# Patient Record
Sex: Male | Born: 1950
Health system: Southern US, Community
[De-identification: ages and names within clinical notes are randomized; demographics above are authoritative.]

## PROBLEM LIST (undated history)

## (undated) DIAGNOSIS — Z973 Presence of spectacles and contact lenses: Secondary | ICD-10-CM

## (undated) DIAGNOSIS — Z972 Presence of dental prosthetic device (complete) (partial): Secondary | ICD-10-CM

## (undated) DIAGNOSIS — K439 Ventral hernia without obstruction or gangrene: Secondary | ICD-10-CM

## (undated) DIAGNOSIS — Z974 Presence of external hearing-aid: Secondary | ICD-10-CM

## (undated) DIAGNOSIS — E782 Mixed hyperlipidemia: Secondary | ICD-10-CM

## (undated) DIAGNOSIS — H9190 Unspecified hearing loss, unspecified ear: Secondary | ICD-10-CM

## (undated) DIAGNOSIS — I10 Essential (primary) hypertension: Secondary | ICD-10-CM

## (undated) DIAGNOSIS — E7800 Pure hypercholesterolemia, unspecified: Secondary | ICD-10-CM

## (undated) DIAGNOSIS — C801 Malignant (primary) neoplasm, unspecified: Secondary | ICD-10-CM

## (undated) DIAGNOSIS — Z8546 Personal history of malignant neoplasm of prostate: Secondary | ICD-10-CM

## (undated) HISTORY — PX: APPENDECTOMY: SHX54

---

## 2015-06-26 DIAGNOSIS — I10 Essential (primary) hypertension: Secondary | ICD-10-CM | POA: Diagnosis not present

## 2015-06-26 DIAGNOSIS — E7800 Pure hypercholesterolemia, unspecified: Secondary | ICD-10-CM | POA: Diagnosis not present

## 2015-06-26 DIAGNOSIS — Z1389 Encounter for screening for other disorder: Secondary | ICD-10-CM | POA: Diagnosis not present

## 2015-06-26 DIAGNOSIS — Z125 Encounter for screening for malignant neoplasm of prostate: Secondary | ICD-10-CM | POA: Diagnosis not present

## 2015-06-26 DIAGNOSIS — R7303 Prediabetes: Secondary | ICD-10-CM | POA: Diagnosis not present

## 2015-09-04 DIAGNOSIS — R972 Elevated prostate specific antigen [PSA]: Secondary | ICD-10-CM | POA: Diagnosis not present

## 2015-10-23 DIAGNOSIS — N402 Nodular prostate without lower urinary tract symptoms: Secondary | ICD-10-CM | POA: Diagnosis not present

## 2015-10-23 DIAGNOSIS — Z0000 Encounter for general adult medical examination without abnormal findings: Secondary | ICD-10-CM | POA: Diagnosis not present

## 2015-10-23 DIAGNOSIS — R972 Elevated prostate specific antigen [PSA]: Secondary | ICD-10-CM | POA: Diagnosis not present

## 2015-11-28 DIAGNOSIS — R972 Elevated prostate specific antigen [PSA]: Secondary | ICD-10-CM | POA: Diagnosis not present

## 2015-11-29 DIAGNOSIS — R972 Elevated prostate specific antigen [PSA]: Secondary | ICD-10-CM | POA: Diagnosis not present

## 2015-11-29 DIAGNOSIS — Z0000 Encounter for general adult medical examination without abnormal findings: Secondary | ICD-10-CM | POA: Diagnosis not present

## 2015-12-26 DIAGNOSIS — C61 Malignant neoplasm of prostate: Secondary | ICD-10-CM | POA: Diagnosis not present

## 2015-12-26 DIAGNOSIS — R7303 Prediabetes: Secondary | ICD-10-CM | POA: Diagnosis not present

## 2015-12-26 DIAGNOSIS — Z9181 History of falling: Secondary | ICD-10-CM | POA: Diagnosis not present

## 2015-12-26 DIAGNOSIS — E7800 Pure hypercholesterolemia, unspecified: Secondary | ICD-10-CM | POA: Diagnosis not present

## 2015-12-26 DIAGNOSIS — Z6830 Body mass index (BMI) 30.0-30.9, adult: Secondary | ICD-10-CM | POA: Diagnosis not present

## 2015-12-26 DIAGNOSIS — I10 Essential (primary) hypertension: Secondary | ICD-10-CM | POA: Diagnosis not present

## 2016-01-11 DIAGNOSIS — C61 Malignant neoplasm of prostate: Secondary | ICD-10-CM | POA: Diagnosis not present

## 2016-02-13 DIAGNOSIS — C61 Malignant neoplasm of prostate: Secondary | ICD-10-CM | POA: Diagnosis not present

## 2016-02-16 ENCOUNTER — Other Ambulatory Visit: Payer: Self-pay | Admitting: Urology

## 2016-02-27 DIAGNOSIS — C61 Malignant neoplasm of prostate: Secondary | ICD-10-CM | POA: Diagnosis not present

## 2016-02-27 DIAGNOSIS — M6281 Muscle weakness (generalized): Secondary | ICD-10-CM | POA: Diagnosis not present

## 2016-03-04 DIAGNOSIS — C61 Malignant neoplasm of prostate: Secondary | ICD-10-CM | POA: Diagnosis not present

## 2016-03-04 DIAGNOSIS — M6281 Muscle weakness (generalized): Secondary | ICD-10-CM | POA: Diagnosis not present

## 2016-03-11 DIAGNOSIS — C61 Malignant neoplasm of prostate: Secondary | ICD-10-CM | POA: Diagnosis not present

## 2016-03-11 DIAGNOSIS — M6281 Muscle weakness (generalized): Secondary | ICD-10-CM | POA: Diagnosis not present

## 2016-03-20 NOTE — Patient Instructions (Addendum)
Kyle Flowers  03/20/2016   Your procedure is scheduled on: 03/25/16  Report to [facility]  Entrance take [place]  elevators to 3rd floor to  [place] at [place] AM.  Call this number if you have problems the morning of surgery [phone] BOWEL PREP AS DIRECTED BY OFFICE--INCREASE FLUIDS DAY BEFORE  Remember: ONLY 1 PERSON MAY GO WITH YOU TO SHORT STAY TO GET  READY MORNING OF YOUR SURGERY.  Do not take anything by mouth :After Midnight.     Take these medicines the morning of surgery with A SIP OF WATER: none DO NOT TAKE ANY DIABETIC MEDICATIONS DAY OF YOUR SURGERY                               You may not have any metal on your body including hair pins and              piercings  Do not wear jewelry, make-up, lotions, powders or perfumes, deodorant             Do not wear nail polish.  Do not shave  48 hours prior to surgery.              Men may shave face and neck.   Do not bring valuables to the hospital. [place].  Contacts, dentures or bridgework may not be worn into surgery.  Leave suitcase in the car. After surgery it may be brought to your room.              [place] - Preparing for Surgery Before surgery, you can play an important role.  Because skin is not sterile, your skin needs to be as free of germs as possible.  You can reduce the number of germs on your skin by washing with CHG (chlorahexidine gluconate) soap before surgery.  CHG is an antiseptic cleaner which kills germs and bonds with the skin to continue killing germs even after washing. Please DO NOT use if you have an allergy to CHG or antibacterial soaps.  If your skin becomes reddened/irritated stop using the CHG and inform your nurse when you arrive at Short Stay. Do not shave (including legs and underarms) for at least 48 hours prior to the first CHG shower.  You may shave your face/neck. Please follow these instructions  carefully:  1.  Shower with CHG Soap the night before surgery and the  morning of Surgery.  2.  If you choose to wash your hair, wash your hair first as usual with your  normal  shampoo.  3.  After you shampoo, rinse your hair and body thoroughly to remove the  shampoo.                           4.  Use CHG as you would any other liquid soap.  You can apply chg directly  to the skin and wash                       Gently with a scrungie or clean washcloth.  5.  Apply the CHG Soap to your body ONLY FROM THE NECK DOWN.   Do  not use on face/ open                           Wound or open sores. Avoid contact with eyes, ears mouth and genitals (private parts).                       Wash face,  Genitals (private parts) with your normal soap.             6.  Wash thoroughly, paying special attention to the area where your surgery  will be performed.  7.  Thoroughly rinse your body with warm water from the neck down.  8.  DO NOT shower/wash with your normal soap after using and rinsing off  the CHG Soap.                9.  Pat yourself dry with a clean towel.            10.  Wear clean pajamas.            11.  Place clean sheets on your bed the night of your first shower and do not  sleep with pets. Day of Surgery : Do not apply any lotions/deodorants the morning of surgery.  Please wear clean clothes to the hospital/surgery center.  FAILURE TO FOLLOW THESE INSTRUCTIONS MAY RESULT IN THE CANCELLATION OF YOUR SURGERY PATIENT SIGNATURE_________________________________  NURSE SIGNATURE__________________________________  ________________________________________________________________________    CLEAR LIQUID DIET   Foods Allowed                                                                     Foods Excluded  Coffee and tea, regular and decaf                             liquids that you cannot  Plain Jell-O in any flavor                                             see through such as: Fruit ices  (not with fruit pulp)                                     milk, soups, orange juice  Iced Popsicles                                    All solid food Carbonated beverages, regular and diet                                    Cranberry, grape and apple juices Sports drinks like Gatorade Lightly seasoned clear broth or consume(fat free) Sugar, honey syrup  Sample Menu Breakfast  Lunch                                     Supper Cranberry juice                    Beef broth                            Chicken broth Jell-O                                     Grape juice                           Apple juice Coffee or tea                        Jell-O                                      Popsicle                                                Coffee or tea                        Coffee or tea  _____________________________________________________________________    Incentive Spirometer  An incentive spirometer is a tool that can help keep your lungs clear and active. This tool measures how well you are filling your lungs with each breath. Taking long deep breaths may help reverse or decrease the chance of developing breathing (pulmonary) problems (especially infection) following:  A long period of time when you are unable to move or be active. BEFORE THE PROCEDURE   If the spirometer includes an indicator to show your best effort, your nurse or respiratory therapist will set it to a desired goal.  If possible, sit up straight or lean slightly forward. Try not to slouch.  Hold the incentive spirometer in an upright position. INSTRUCTIONS FOR USE  1. Sit on the edge of your bed if possible, or sit up as far as you can in bed or on a chair. 2. Hold the incentive spirometer in an upright position. 3. Breathe out normally. 4. Place the mouthpiece in your mouth and seal your lips tightly around it. 5. Breathe in slowly and as deeply as possible, raising the piston  or the ball toward the top of the column. 6. Hold your breath for 3-5 seconds or for as long as possible. Allow the piston or ball to fall to the bottom of the column. 7. Remove the mouthpiece from your mouth and breathe out normally. 8. Rest for a few seconds and repeat Steps 1 through 7 at least 10 times every 1-2 hours when you are awake. Take your time and take a few normal breaths between deep breaths. 9. The spirometer may include an indicator to show your best effort. Use the indicator as a goal to work toward during each repetition. 10. After each set of 10 deep breaths,  practice coughing to be sure your lungs are clear. If you have an incision (the cut made at the time of surgery), support your incision when coughing by placing a pillow or rolled up towels firmly against it. Once you are able to get out of bed, walk around indoors and cough well. You may stop using the incentive spirometer when instructed by your caregiver.  RISKS AND COMPLICATIONS  Take your time so you do not get dizzy or light-headed.  If you are in pain, you may need to take or ask for pain medication before doing incentive spirometry. It is harder to take a deep breath if you are having pain. AFTER USE  Rest and breathe slowly and easily.  It can be helpful to keep track of a log of your progress. Your caregiver can provide you with a simple table to help with this. If you are using the spirometer at home, follow these instructions: [place] IF:   You are having difficultly using the spirometer.  You have trouble using the spirometer as often as instructed.  Your pain medication is not giving enough relief while using the spirometer.  You develop fever of 100.5 F (38.1 C) or higher. SEEK IMMEDIATE MEDICAL CARE IF:   You cough up bloody sputum that had not been present before.  You develop fever of 102 F (38.9 C) or greater.  You develop worsening pain at or near the incision site. MAKE  SURE YOU:   Understand these instructions.  Will watch your condition.  Will get help right away if you are not doing well or get worse. Document Released: 11/25/2006 Document Revised: 10/07/2011 Document Reviewed: 01/26/2007 ExitCare Patient Information 2014 Exit[employer], [place].   ________________________________________________________________________  WHAT IS A BLOOD TRANSFUSION? Blood Transfusion Information  A transfusion is the replacement of blood or some of its parts. Blood is made up of multiple cells which provide different functions.  Red blood cells carry oxygen and are used for blood loss replacement.  White blood cells fight against infection.  Platelets control bleeding.  Plasma helps clot blood.  Other blood products are available for specialized needs, such as hemophilia or other clotting disorders. BEFORE THE TRANSFUSION  Who gives blood for transfusions?   Healthy volunteers who are fully evaluated to make sure their blood is safe. This is blood bank blood. Transfusion therapy is the safest it has ever been in the practice of medicine. Before blood is taken from a donor, a complete history is taken to make sure that person has no history of diseases nor engages in risky social behavior (examples are intravenous drug use or sexual activity with multiple partners). The donor's travel history is screened to minimize risk of transmitting infections, such as malaria. The donated blood is tested for signs of infectious diseases, such as HIV and hepatitis. The blood is then tested to be sure it is compatible with you in order to minimize the chance of a transfusion reaction. If you or a relative donates blood, this is often done in anticipation of surgery and is not appropriate for emergency situations. It takes many days to process the donated blood. RISKS AND COMPLICATIONS Although transfusion therapy is very safe and saves many lives, the main dangers of transfusion  include:   Getting an infectious disease.  Developing a transfusion reaction. This is an allergic reaction to something in the blood you were given. Every precaution is taken to prevent this. The decision to have a blood transfusion has been  considered carefully by your caregiver before blood is given. Blood is not given unless the benefits outweigh the risks. AFTER THE TRANSFUSION  Right after receiving a blood transfusion, you will usually feel much better and more energetic. This is especially true if your red blood cells have gotten low (anemic). The transfusion raises the level of the red blood cells which carry oxygen, and this usually causes an energy increase.  The nurse administering the transfusion will monitor you carefully for complications. HOME CARE INSTRUCTIONS  No special instructions are needed after a transfusion. You may find your energy is better. Speak with your caregiver about any limitations on activity for underlying diseases you may have. SEEK MEDICAL CARE IF:   Your condition is not improving after your transfusion.  You develop redness or irritation at the intravenous (IV) site. SEEK IMMEDIATE MEDICAL CARE IF:  Any of the following symptoms occur over the next 12 hours:  Shaking chills.  You have a temperature by mouth above 102 F (38.9 C), not controlled by medicine.  Chest, back, or muscle pain.  People around you feel you are not acting correctly or are confused.  Shortness of breath or difficulty breathing.  Dizziness and fainting.  You get a rash or develop hives.  You have a decrease in urine output.  Your urine turns a dark color or changes to pink, red, or brown. Any of the following symptoms occur over the next 10 days:  You have a temperature by mouth above 102 F (38.9 C), not controlled by medicine.  Shortness of breath.  Weakness after normal activity.  The white part of the eye turns yellow (jaundice).  You have a decrease in  the amount of urine or are urinating less often.  Your urine turns a dark color or changes to pink, red, or brown. Document Released: 07/12/2000 Document Revised: 10/07/2011 Document Reviewed: 02/29/2008 [facility] Patient Information 2014 [place], [place].  _______________________________________________________________________

## 2016-03-21 ENCOUNTER — Encounter (HOSPITAL_COMMUNITY): Payer: Self-pay

## 2016-03-21 ENCOUNTER — Encounter (HOSPITAL_COMMUNITY)
Admission: RE | Admit: 2016-03-21 | Discharge: 2016-03-21 | Disposition: A | Discharge status: Home / Self Care | Payer: Medicare HMO | Source: Ambulatory Visit | Attending: Urology | Admitting: Urology

## 2016-03-21 ENCOUNTER — Ambulatory Visit (HOSPITAL_COMMUNITY)
Admission: RE | Admit: 2016-03-21 | Discharge: 2016-03-21 | Disposition: A | Discharge status: Home / Self Care | Payer: Medicare HMO | Source: Ambulatory Visit | Attending: Urology | Admitting: Urology

## 2016-03-21 DIAGNOSIS — Z0189 Encounter for other specified special examinations: Secondary | ICD-10-CM

## 2016-03-21 DIAGNOSIS — J986 Disorders of diaphragm: Secondary | ICD-10-CM | POA: Insufficient documentation

## 2016-03-21 DIAGNOSIS — I700 Atherosclerosis of aorta: Secondary | ICD-10-CM | POA: Insufficient documentation

## 2016-03-21 DIAGNOSIS — J9811 Atelectasis: Secondary | ICD-10-CM | POA: Insufficient documentation

## 2016-03-21 DIAGNOSIS — Z01818 Encounter for other preprocedural examination: Secondary | ICD-10-CM | POA: Diagnosis not present

## 2016-03-21 DIAGNOSIS — C61 Malignant neoplasm of prostate: Secondary | ICD-10-CM | POA: Diagnosis not present

## 2016-03-21 HISTORY — DX: Essential (primary) hypertension: I10

## 2016-03-21 HISTORY — DX: Unspecified hearing loss, unspecified ear: H91.90

## 2016-03-21 HISTORY — DX: Malignant (primary) neoplasm, unspecified: C80.1

## 2016-03-21 HISTORY — DX: Pure hypercholesterolemia, unspecified: E78.00

## 2016-03-21 LAB — CBC
HCT: 47.2 % (ref 39.0–52.0)
Hemoglobin: 16 g/dL (ref 13.0–17.0)
MCH: 31.6 pg (ref 26.0–34.0)
MCHC: 33.9 g/dL (ref 30.0–36.0)
MCV: 93.1 fL (ref 78.0–100.0)
PLATELETS: 201 10*3/uL (ref 150–400)
RBC: 5.07 MIL/uL (ref 4.22–5.81)
RDW: 13.6 % (ref 11.5–15.5)
WBC: 6.2 10*3/uL (ref 4.0–10.5)

## 2016-03-21 LAB — BASIC METABOLIC PANEL
Anion gap: 4 — ABNORMAL LOW (ref 5–15)
BUN: 21 mg/dL — ABNORMAL HIGH (ref 6–20)
CALCIUM: 9.2 mg/dL (ref 8.9–10.3)
CO2: 28 mmol/L (ref 22–32)
CREATININE: 1.02 mg/dL (ref 0.61–1.24)
Chloride: 106 mmol/L (ref 101–111)
GFR calc non Af Amer: >60 mL/min (ref >60)
GLUCOSE: 92 mg/dL (ref 65–99)
Potassium: 4.8 mmol/L (ref 3.5–5.1)
Sodium: 138 mmol/L (ref 135–145)

## 2016-03-21 LAB — ABO/RH: ABO/RH(D): B NEG

## 2016-03-21 NOTE — Progress Notes (Signed)
States has bowel prep instructions from office

## 2016-03-22 NOTE — H&P (Signed)
CC/HPI: CC: Prostate Cancer     Kyle Flowers is a 65 year old gentleman who was noted to have L base induration on his DRE and an elevated PSA of 4.2 prompting a TRUS biopsy of the prostate on 11/29/15 that demonstrated Gleason 3+4=7 adenocarcinoma with 7 out of 12 biopsy cores to be positive for malignancy. He has no family history of prostate cancer.   PMH: He has a history of dyslipidemia and hypertension.  PSH: He is s/p appendectomy. RLQ incision.   TNM stage: cT2a Nx Mx (L base induration)  PSA: 4.42  Gleason score: 3+4=7  Biopsy (11/29/15): 7/12 cores positive  Left: L lateral mid (5%, 3+3=6), L mid (10%, 3+3=6), L lateral base (20%, 3+4=7), L base (20%, 3+4=7)  Right: R lateral apex (5%, 3+3=6), R mid (<5%, 3+3=6), R lateral base (5%, 3+3=6)  Prostate volume: 23.8 cc   Nomogram  OC disease: 35%  EPE: 65%  SVI: 7%  LNI: 6%  PFS (5 year, 10 year): 85%, 74%   Urinary function: IPSS is 0.  Erectile function: SHIM score is 1. He is sexually inactive and has had severe erectile dysfunction since getting therapy for hypertension.     ALLERGIES: No Allergies    MEDICATIONS: Aspirin 81 MG TABS Oral  Lisinopril 20 MG Oral Tablet Oral  Lovastatin 40 MG Oral Tablet Oral     GU PSH: None     PSH Notes: Appendectomy   NON-GU PSH: Appendectomy - 10/23/2015    GU PMH: Prostate Cancer, Prostate cancer - 12/06/2015    NON-GU PMH: Personal history of other diseases of the circulatory system, History of hypertension - 10/23/2015 Personal history of other endocrine, nutritional and metabolic disease, History of hypercholesterolemia - 10/23/2015    FAMILY HISTORY: cerebral aneurysm - Runs In Family malignant neoplasm - Runs In Family   SOCIAL HISTORY: Marital Status: Married     Notes: Occupation, Married, Alcohol use, Caffeine use, Never smoker, Number of children   REVIEW OF SYSTEMS:    GU Review Male:   Patient denies frequent urination, hard to postpone urination, burning/  pain with urination, get up at night to urinate, leakage of urine, stream starts and stops, trouble starting your streams, and have to strain to urinate .  Gastrointestinal (Lower):   Patient denies diarrhea and constipation.  Gastrointestinal (Upper):   Patient denies nausea and vomiting.  Constitutional:   Patient denies fever, night sweats, weight loss, and fatigue.  Skin:   Patient denies skin rash/ lesion and itching.  Eyes:   Patient denies blurred vision and double vision.  Ears/ Nose/ Throat:   Patient denies sore throat and sinus problems.  Hematologic/Lymphatic:   Patient denies swollen glands and easy bruising.  Cardiovascular:   Patient denies chest pains and leg swelling.  Respiratory:   Patient denies cough and shortness of breath.  Endocrine:   Patient denies excessive thirst.  Musculoskeletal:   Patient denies back pain and joint pain.  Neurological:   Patient denies headaches and dizziness.  Psychologic:   Patient denies depression and anxiety.      MULTI-SYSTEM PHYSICAL EXAMINATION:    Constitutional: Well-nourished. No physical deformities. Normally developed. Good grooming.  Neck: Neck symmetrical, not swollen. Normal tracheal position.  Respiratory: No labored breathing, no use of accessory muscles.   Cardiovascular: Normal temperature, normal extremity pulses, no swelling, no varicosities.  Lymphatic: No enlargement of neck, axillae, groin.  Skin: No paleness, no jaundice, no cyanosis. No lesion, no ulcer,  no rash.  Neurologic / Psychiatric: Oriented to time, oriented to place, oriented to person. No depression, no anxiety, no agitation.  Gastrointestinal: No mass, no tenderness, no rigidity, non obese abdomen. RLQ incision well healed.  Eyes: Normal conjunctivae. Normal eyelids.  Ears, Nose, Mouth, and Throat: Left ear no scars, no lesions, no masses. Right ear no scars, no lesions, no masses. Nose no scars, no lesions, no masses. Normal hearing. Normal lips.   Musculoskeletal: Normal gait and station of head and neck.       ASSESSMENT:      ICD-10 Details  1 GU:   Prostate Cancer - C61           Notes:   1. Prostate cancer: He elects surgical therapy and will undergo a unilateral right nerve sparing robot-assisted laparoscopic radical prostatectomy and pelvic lymphadenectomy. I discussed the potential benefits and risks of the procedure, side effects of the proposed treatment, the likelihood of the patient achieving the goals of the procedure, and any potential problems that might occur during the procedure or recuperation.

## 2016-03-24 ENCOUNTER — Encounter (HOSPITAL_COMMUNITY): Payer: Self-pay | Admitting: Anesthesiology

## 2016-03-24 NOTE — Anesthesia Preprocedure Evaluation (Addendum)
Anesthesia Evaluation  Patient identified by MRN, date of birth, ID band Patient awake  General Assessment Comment:Hard of hearing.  Reviewed: Allergy & Precautions, NPO status , Patient's Chart, lab work & pertinent test results  Airway Mallampati: II  TM Distance: >3 FB Neck ROM: Full    Dental  (+) Edentulous Upper, Edentulous Lower   Pulmonary neg pulmonary ROS,    Pulmonary exam normal breath sounds clear to auscultation       Cardiovascular Exercise Tolerance: Good hypertension, Normal cardiovascular exam Rhythm:Regular Rate:Normal     Neuro/Psych negative neurological ROS  negative psych ROS   GI/Hepatic negative GI ROS, Neg liver ROS,   Endo/Other  negative endocrine ROS  Renal/GU negative Renal ROS  negative genitourinary   Musculoskeletal negative musculoskeletal ROS (+)   Abdominal   Peds negative pediatric ROS (+)  Hematology negative hematology ROS (+)   Anesthesia Other Findings   Reproductive/Obstetrics negative OB ROS                           Anesthesia Physical Anesthesia Plan  ASA: II  Anesthesia Plan: General   Post-op Pain Management:    Induction: Intravenous  Airway Management Planned: Oral ETT  Additional Equipment:   Intra-op Plan:   Post-operative Plan: Extubation in OR  Informed Consent: I have reviewed the patients History and Physical, chart, labs and discussed the procedure including the risks, benefits and alternatives for the proposed anesthesia with the patient or authorized representative who has indicated his/her understanding and acceptance.   Dental advisory given  Plan Discussed with: CRNA  Anesthesia Plan Comments:         Anesthesia Quick Evaluation

## 2016-03-25 ENCOUNTER — Inpatient Hospital Stay (HOSPITAL_COMMUNITY): Payer: Medicare HMO | Admitting: Anesthesiology

## 2016-03-25 ENCOUNTER — Encounter (HOSPITAL_COMMUNITY): Payer: Self-pay | Admitting: *Deleted

## 2016-03-25 ENCOUNTER — Encounter (HOSPITAL_COMMUNITY)
Admission: RE | Disposition: A | Discharge status: Home / Self Care | Payer: Self-pay | Source: Ambulatory Visit | Attending: Urology

## 2016-03-25 ENCOUNTER — Inpatient Hospital Stay (HOSPITAL_COMMUNITY)
Admission: RE | Admit: 2016-03-25 | Discharge: 2016-03-26 | DRG: 708 | Disposition: A | Discharge status: Home / Self Care | Payer: Medicare HMO | Source: Ambulatory Visit | Attending: Urology | Admitting: Urology

## 2016-03-25 DIAGNOSIS — Z7982 Long term (current) use of aspirin: Secondary | ICD-10-CM | POA: Diagnosis not present

## 2016-03-25 DIAGNOSIS — Z79899 Other long term (current) drug therapy: Secondary | ICD-10-CM

## 2016-03-25 DIAGNOSIS — I10 Essential (primary) hypertension: Secondary | ICD-10-CM | POA: Diagnosis present

## 2016-03-25 DIAGNOSIS — C61 Malignant neoplasm of prostate: Secondary | ICD-10-CM | POA: Diagnosis not present

## 2016-03-25 DIAGNOSIS — E785 Hyperlipidemia, unspecified: Secondary | ICD-10-CM | POA: Diagnosis present

## 2016-03-25 HISTORY — PX: ROBOT ASSISTED LAPAROSCOPIC RADICAL PROSTATECTOMY: SHX5141

## 2016-03-25 HISTORY — PX: LYMPHADENECTOMY: SHX5960

## 2016-03-25 LAB — HEMOGLOBIN AND HEMATOCRIT, BLOOD
HCT: 44.9 % (ref 39.0–52.0)
Hemoglobin: 14.8 g/dL (ref 13.0–17.0)

## 2016-03-25 LAB — TYPE AND SCREEN
ABO/RH(D): B NEG
Antibody Screen: NEGATIVE

## 2016-03-25 SURGERY — XI ROBOTIC ASSISTED LAPAROSCOPIC RADICAL PROSTATECTOMY LEVEL 2
Anesthesia: General

## 2016-03-25 MED ORDER — PHENYLEPHRINE HCL 10 MG/ML IJ SOLN
INTRAMUSCULAR | Status: DC | PRN
  Administered 2016-03-25: 120 ug via INTRAVENOUS
  Administered 2016-03-25: 160 ug via INTRAVENOUS

## 2016-03-25 MED ORDER — HYDROMORPHONE HCL 1 MG/ML IJ SOLN: 0.2500 mg | INTRAMUSCULAR | Status: DC | PRN

## 2016-03-25 MED ORDER — CEFAZOLIN SODIUM-DEXTROSE 2-4 GM/100ML-% IV SOLN
2.0000 g | INTRAVENOUS | Status: AC
  Administered 2016-03-25: 2 g via INTRAVENOUS

## 2016-03-25 MED ORDER — SODIUM CHLORIDE 0.9 % IJ SOLN
INTRAMUSCULAR | Status: AC
  Filled 2016-03-25: qty 10

## 2016-03-25 MED ORDER — ONDANSETRON HCL 4 MG/2ML IJ SOLN
INTRAMUSCULAR | Status: DC | PRN
  Administered 2016-03-25: 4 mg via INTRAVENOUS

## 2016-03-25 MED ORDER — KCL IN DEXTROSE-NACL 20-5-0.45 MEQ/L-%-% IV SOLN
INTRAVENOUS | Status: DC
  Administered 2016-03-25 – 2016-03-26 (×3): via INTRAVENOUS
  Filled 2016-03-25 (×3): qty 1000

## 2016-03-25 MED ORDER — DIPHENHYDRAMINE HCL 50 MG/ML IJ SOLN: 12.5000 mg | Freq: Four times a day (QID) | INTRAMUSCULAR | Status: DC | PRN

## 2016-03-25 MED ORDER — ACETAMINOPHEN 500 MG PO TABS
ORAL_TABLET | ORAL | Status: AC
  Filled 2016-03-25: qty 1

## 2016-03-25 MED ORDER — DOCUSATE SODIUM 100 MG PO CAPS
100.0000 mg | ORAL_CAPSULE | Freq: Two times a day (BID) | ORAL | Status: DC
  Administered 2016-03-25 – 2016-03-26 (×2): 100 mg via ORAL
  Filled 2016-03-25 (×2): qty 1

## 2016-03-25 MED ORDER — PROMETHAZINE HCL 25 MG/ML IJ SOLN: 6.2500 mg | INTRAMUSCULAR | Status: DC | PRN

## 2016-03-25 MED ORDER — PROPOFOL 10 MG/ML IV BOLUS
INTRAVENOUS | Status: AC
  Filled 2016-03-25: qty 20

## 2016-03-25 MED ORDER — SUGAMMADEX SODIUM 200 MG/2ML IV SOLN
INTRAVENOUS | Status: DC | PRN
  Administered 2016-03-25: 200 mg via INTRAVENOUS

## 2016-03-25 MED ORDER — MORPHINE SULFATE (PF) 2 MG/ML IV SOLN: 2.0000 mg | INTRAVENOUS | Status: DC | PRN

## 2016-03-25 MED ORDER — ACETAMINOPHEN 325 MG PO TABS
ORAL_TABLET | ORAL | Status: DC | PRN
  Administered 2016-03-25: 1000 mg via ORAL

## 2016-03-25 MED ORDER — SODIUM CHLORIDE 0.9 % IV BOLUS (SEPSIS)
1000.0000 mL | Freq: Once | INTRAVENOUS | Status: AC
  Administered 2016-03-25: 1000 mL via INTRAVENOUS

## 2016-03-25 MED ORDER — ROCURONIUM BROMIDE 100 MG/10ML IV SOLN
INTRAVENOUS | Status: DC | PRN
  Administered 2016-03-25: 20 mg via INTRAVENOUS
  Administered 2016-03-25: 60 mg via INTRAVENOUS

## 2016-03-25 MED ORDER — LIDOCAINE HCL (CARDIAC) 20 MG/ML IV SOLN
INTRAVENOUS | Status: AC
  Filled 2016-03-25: qty 5

## 2016-03-25 MED ORDER — DEXAMETHASONE SODIUM PHOSPHATE 10 MG/ML IJ SOLN
INTRAMUSCULAR | Status: AC
  Filled 2016-03-25: qty 1

## 2016-03-25 MED ORDER — HEPARIN SODIUM (PORCINE) 1000 UNIT/ML IJ SOLN
INTRAMUSCULAR | Status: AC
  Filled 2016-03-25: qty 1

## 2016-03-25 MED ORDER — ACETAMINOPHEN 325 MG PO TABS: 650.0000 mg | ORAL_TABLET | ORAL | Status: DC | PRN

## 2016-03-25 MED ORDER — SUFENTANIL CITRATE 50 MCG/ML IV SOLN
INTRAVENOUS | Status: AC
  Filled 2016-03-25: qty 1

## 2016-03-25 MED ORDER — PRAVASTATIN SODIUM 40 MG PO TABS
40.0000 mg | ORAL_TABLET | Freq: Every day | ORAL | Status: DC
  Administered 2016-03-25: 40 mg via ORAL
  Filled 2016-03-25: qty 1

## 2016-03-25 MED ORDER — CEFAZOLIN IN D5W 1 GM/50ML IV SOLN
1.0000 g | Freq: Three times a day (TID) | INTRAVENOUS | Status: AC
  Administered 2016-03-25 (×2): 1 g via INTRAVENOUS
  Filled 2016-03-25 (×2): qty 50

## 2016-03-25 MED ORDER — MIDAZOLAM HCL 2 MG/2ML IJ SOLN
INTRAMUSCULAR | Status: AC
  Filled 2016-03-25: qty 2

## 2016-03-25 MED ORDER — PROPOFOL 10 MG/ML IV BOLUS
INTRAVENOUS | Status: AC
  Filled 2016-03-25: qty 40

## 2016-03-25 MED ORDER — MIDAZOLAM HCL 5 MG/5ML IJ SOLN
INTRAMUSCULAR | Status: DC | PRN
  Administered 2016-03-25: 2 mg via INTRAVENOUS

## 2016-03-25 MED ORDER — KETOROLAC TROMETHAMINE 15 MG/ML IJ SOLN
15.0000 mg | Freq: Four times a day (QID) | INTRAMUSCULAR | Status: DC
  Administered 2016-03-25 – 2016-03-26 (×4): 15 mg via INTRAVENOUS
  Filled 2016-03-25 (×4): qty 1

## 2016-03-25 MED ORDER — PROPOFOL 10 MG/ML IV BOLUS
INTRAVENOUS | Status: DC | PRN
  Administered 2016-03-25: 50 mg via INTRAVENOUS
  Administered 2016-03-25: 200 mg via INTRAVENOUS

## 2016-03-25 MED ORDER — LACTATED RINGERS IV SOLN: INTRAVENOUS | Status: DC

## 2016-03-25 MED ORDER — HYDROCODONE-ACETAMINOPHEN 5-325 MG PO TABS: 1.0000 | ORAL_TABLET | Freq: Four times a day (QID) | ORAL | 0 refills | Status: DC | PRN

## 2016-03-25 MED ORDER — SODIUM CHLORIDE 0.9 % IR SOLN
Status: DC | PRN
  Administered 2016-03-25: 1000 mL

## 2016-03-25 MED ORDER — CEFAZOLIN IN D5W 1 GM/50ML IV SOLN
INTRAVENOUS | Status: AC
  Filled 2016-03-25: qty 50

## 2016-03-25 MED ORDER — ROCURONIUM BROMIDE 100 MG/10ML IV SOLN
INTRAVENOUS | Status: AC
  Filled 2016-03-25: qty 1

## 2016-03-25 MED ORDER — LACTATED RINGERS IV SOLN
INTRAVENOUS | Status: DC | PRN
  Administered 2016-03-25 (×2): via INTRAVENOUS

## 2016-03-25 MED ORDER — BUPIVACAINE-EPINEPHRINE 0.25% -1:200000 IJ SOLN
INTRAMUSCULAR | Status: DC | PRN
  Administered 2016-03-25: 30 mL

## 2016-03-25 MED ORDER — LIDOCAINE HCL (CARDIAC) 20 MG/ML IV SOLN
INTRAVENOUS | Status: DC | PRN
  Administered 2016-03-25: 75 mg via INTRAVENOUS
  Administered 2016-03-25: 25 mg via INTRATRACHEAL

## 2016-03-25 MED ORDER — HYDROMORPHONE HCL 1 MG/ML IJ SOLN
INTRAMUSCULAR | Status: DC | PRN
  Administered 2016-03-25: 1 mg via INTRAVENOUS

## 2016-03-25 MED ORDER — FENTANYL CITRATE (PF) 100 MCG/2ML IJ SOLN
INTRAMUSCULAR | Status: AC
  Filled 2016-03-25: qty 2

## 2016-03-25 MED ORDER — SUFENTANIL CITRATE 50 MCG/ML IV SOLN
INTRAVENOUS | Status: DC | PRN
  Administered 2016-03-25 (×3): 10 ug via INTRAVENOUS
  Administered 2016-03-25: 5 ug via INTRAVENOUS
  Administered 2016-03-25: 10 ug via INTRAVENOUS
  Administered 2016-03-25: 5 ug via INTRAVENOUS

## 2016-03-25 MED ORDER — LACTATED RINGERS IV SOLN
INTRAVENOUS | Status: DC | PRN
  Administered 2016-03-25: 1000 mL

## 2016-03-25 MED ORDER — SULFAMETHOXAZOLE-TRIMETHOPRIM 800-160 MG PO TABS: 1.0000 | ORAL_TABLET | Freq: Two times a day (BID) | ORAL | 0 refills | Status: DC

## 2016-03-25 MED ORDER — DIPHENHYDRAMINE HCL 12.5 MG/5ML PO ELIX: 12.5000 mg | ORAL_SOLUTION | Freq: Four times a day (QID) | ORAL | Status: DC | PRN

## 2016-03-25 MED ORDER — ONDANSETRON HCL 4 MG/2ML IJ SOLN
INTRAMUSCULAR | Status: AC
  Filled 2016-03-25: qty 2

## 2016-03-25 MED ORDER — PHENYLEPHRINE 40 MCG/ML (10ML) SYRINGE FOR IV PUSH (FOR BLOOD PRESSURE SUPPORT)
PREFILLED_SYRINGE | INTRAVENOUS | Status: AC
  Filled 2016-03-25: qty 10

## 2016-03-25 MED ORDER — SUGAMMADEX SODIUM 200 MG/2ML IV SOLN
INTRAVENOUS | Status: AC
  Filled 2016-03-25: qty 2

## 2016-03-25 MED ORDER — DEXAMETHASONE SODIUM PHOSPHATE 10 MG/ML IJ SOLN
INTRAMUSCULAR | Status: DC | PRN
  Administered 2016-03-25: 10 mg via INTRAVENOUS

## 2016-03-25 MED ORDER — HYDROMORPHONE HCL 2 MG/ML IJ SOLN
INTRAMUSCULAR | Status: AC
Start: 2016-03-25 — End: 2016-03-25
  Filled 2016-03-25: qty 1

## 2016-03-25 MED ORDER — BUPIVACAINE-EPINEPHRINE (PF) 0.25% -1:200000 IJ SOLN
INTRAMUSCULAR | Status: AC
Start: 2016-03-25 — End: 2016-03-25
  Filled 2016-03-25: qty 30

## 2016-03-25 SURGICAL SUPPLY — 51 items
APPLICATOR COTTON TIP 6IN STRL (MISCELLANEOUS) ×3 IMPLANT
CATH FOLEY 2WAY SLVR 18FR 30CC (CATHETERS) ×3 IMPLANT
CATH ROBINSON RED A/P 16FR (CATHETERS) ×3 IMPLANT
CATH ROBINSON RED A/P 8FR (CATHETERS) ×3 IMPLANT
CATH TIEMANN FOLEY 18FR 5CC (CATHETERS) ×3 IMPLANT
CHLORAPREP W/TINT 26ML (MISCELLANEOUS) ×3 IMPLANT
CLIP LIGATING HEM O LOK PURPLE (MISCELLANEOUS) ×6 IMPLANT
COVER SURGICAL LIGHT HANDLE (MISCELLANEOUS) ×3 IMPLANT
COVER TIP SHEARS 8 DVNC (MISCELLANEOUS) ×2 IMPLANT
COVER TIP SHEARS 8MM DA VINCI (MISCELLANEOUS) ×1
CUTTER ECHEON FLEX ENDO 45 340 (ENDOMECHANICALS) ×3 IMPLANT
DECANTER SPIKE VIAL GLASS SM (MISCELLANEOUS) ×3 IMPLANT
DRAPE ARM DVNC X/XI (DISPOSABLE) ×8 IMPLANT
DRAPE COLUMN DVNC XI (DISPOSABLE) ×2 IMPLANT
DRAPE DA VINCI XI ARM (DISPOSABLE) ×4
DRAPE DA VINCI XI COLUMN (DISPOSABLE) ×1
DRAPE SURG IRRIG POUCH 19X23 (DRAPES) ×3 IMPLANT
DRSG TEGADERM 4X4.75 (GAUZE/BANDAGES/DRESSINGS) ×3 IMPLANT
ELECT REM PT RETURN 9FT ADLT (ELECTROSURGICAL) ×3
ELECTRODE REM PT RTRN 9FT ADLT (ELECTROSURGICAL) ×2 IMPLANT
GLOVE BIO SURGEON STRL SZ 6.5 (GLOVE) ×3 IMPLANT
GLOVE BIOGEL M STRL SZ7.5 (GLOVE) ×6 IMPLANT
GOWN STRL REUS W/TWL LRG LVL3 (GOWN DISPOSABLE) ×9 IMPLANT
HOLDER FOLEY CATH W/STRAP (MISCELLANEOUS) ×3 IMPLANT
IRRIG SUCT STRYKERFLOW 2 WTIP (MISCELLANEOUS) ×3
IRRIGATION SUCT STRKRFLW 2 WTP (MISCELLANEOUS) ×2 IMPLANT
IV LACTATED RINGERS 1000ML (IV SOLUTION) ×3 IMPLANT
LIQUID BAND (GAUZE/BANDAGES/DRESSINGS) IMPLANT
NDL SAFETY ECLIPSE 18X1.5 (NEEDLE) ×2 IMPLANT
NEEDLE HYPO 18GX1.5 SHARP (NEEDLE) ×1
PACK ROBOT UROLOGY CUSTOM (CUSTOM PROCEDURE TRAY) ×3 IMPLANT
RELOAD GREEN ECHELON 45 (STAPLE) ×3 IMPLANT
SEAL CANN UNIV 5-8 DVNC XI (MISCELLANEOUS) ×8 IMPLANT
SEAL XI 5MM-8MM UNIVERSAL (MISCELLANEOUS) ×4
SOLUTION ELECTROLUBE (MISCELLANEOUS) ×3 IMPLANT
SUT ETHILON 3 0 PS 1 (SUTURE) ×3 IMPLANT
SUT MNCRL 3 0 RB1 (SUTURE) ×2 IMPLANT
SUT MNCRL 3 0 VIOLET RB1 (SUTURE) ×2 IMPLANT
SUT MNCRL AB 4-0 PS2 18 (SUTURE) ×6 IMPLANT
SUT MONOCRYL 3 0 RB1 (SUTURE) ×2
SUT VIC AB 0 CT1 27 (SUTURE) ×1
SUT VIC AB 0 CT1 27XBRD ANTBC (SUTURE) ×2 IMPLANT
SUT VIC AB 0 UR5 27 (SUTURE) ×3 IMPLANT
SUT VIC AB 2-0 SH 27 (SUTURE) ×1
SUT VIC AB 2-0 SH 27X BRD (SUTURE) ×2 IMPLANT
SUT VICRYL 0 UR6 27IN ABS (SUTURE) ×6 IMPLANT
SYR 27GX1/2 1ML LL SAFETY (SYRINGE) ×3 IMPLANT
TOWEL OR 17X26 10 PK STRL BLUE (TOWEL DISPOSABLE) ×3 IMPLANT
TOWEL OR NON WOVEN STRL DISP B (DISPOSABLE) ×3 IMPLANT
TUBING INSUFFLATION 10FT LAP (TUBING) ×3 IMPLANT
WATER STERILE IRR 1500ML POUR (IV SOLUTION) IMPLANT

## 2016-03-25 NOTE — Progress Notes (Signed)
Post-op note  Subjective: The patient is doing well.  No complaints. Denies N/V  Objective: Vital signs in last 24 hours: Temp:  [97.7 F (36.5 C)-98.6 F (37 C)] 97.7 F (36.5 C) (08/28 1106) Pulse Rate:  [87-94] 92 (08/28 1050) Resp:  [11-88] 14 (08/28 1106) BP: (125-134)/(84-96) 134/87 (08/28 1106) SpO2:  [95 %-99 %] 97 % (08/28 1106) Weight:  [93 kg (205 lb)] 93 kg (205 lb) (08/28 0513)  Intake/Output from previous day: No intake/output data recorded. Intake/Output this shift: Total I/O In: 1800 [I.V.:1800] Out: 70 [Drains:20; Blood:50]  Physical Exam:  General: Alert and oriented. Abdomen: Soft, Nondistended. Incisions: Clean and dry. Urine: pink  Lab Results:  Recent Labs  03/25/16 1044  HGB 14.8  HCT 44.9    Assessment/Plan: POD#0   1) Continue to monitor  2) DVT prophy, clears, IS, amb, pain control   LOS: 0 days   Eleanor Gatliff 03/25/2016, 1:55 PM

## 2016-03-25 NOTE — Progress Notes (Signed)
Oral airway removed immediately upon arrival to pacu

## 2016-03-25 NOTE — Care Management Note (Signed)
Case Management Note  Patient Details  Name: Kyle Flowers MRN: [id number] Date of Birth: [birth year]  Subjective/Objective:65 y/o m admitted w/[place] of Prostate. S/p lap rad prostatectomy. From home.                    Action/Plan:d/c home.   Expected Discharge Date:                  Expected Discharge Plan:  Home/Self Care  In-House Referral:     Discharge planning Services  CM Consult  Post Acute Care Choice:    Choice offered to:     DME Arranged:    DME Agency:     HH Arranged:    HH Agency:     Status of Service:  In process, will continue to follow  If discussed at Long Length of Stay Meetings, dates discussed:    Additional Comments:  Dessa Phi, RN 03/25/2016, 1:37 PM

## 2016-03-25 NOTE — Anesthesia Postprocedure Evaluation (Signed)
Anesthesia Post Note  Patient: Kyle Flowers  Procedure(s) Performed: Procedure(s) (LRB): XI ROBOTIC ASSISTED LAPAROSCOPIC RADICAL PROSTATECTOMY LEVEL 2 (N/A) XI Robotic assisted laparoscopic LYMPHADENECTOMY (Bilateral)  Patient location during evaluation: PACU Anesthesia Type: General Level of consciousness: awake and alert Pain management: pain level controlled Vital Signs Assessment: post-procedure vital signs reviewed and stable Respiratory status: spontaneous breathing, nonlabored ventilation, respiratory function stable and patient connected to nasal cannula oxygen Cardiovascular status: blood pressure returned to baseline and stable Postop Assessment: no signs of nausea or vomiting Anesthetic complications: no    Last Vitals:  Vitals:   03/25/16 1050 03/25/16 1106  BP: 131/89 134/87  Pulse: 92   Resp: 12 14  Temp:  36.5 C    Last Pain:  Vitals:   03/25/16 1119  TempSrc:   PainSc: 0-No pain                 Anthonio Mizzell J

## 2016-03-25 NOTE — Discharge Instructions (Signed)

## 2016-03-25 NOTE — Transfer of Care (Signed)
Immediate Anesthesia Transfer of Care Note  Patient: Kyle Flowers  Procedure(s) Performed: Procedure(s): XI ROBOTIC ASSISTED LAPAROSCOPIC RADICAL PROSTATECTOMY LEVEL 2 (N/A) XI Robotic assisted laparoscopic LYMPHADENECTOMY (Bilateral)  Patient Location: PACU  Anesthesia Type:General  Level of Consciousness: awake, alert , oriented and patient cooperative  Airway & Oxygen Therapy: Patient Spontanous Breathing and Patient connected to face mask oxygen  Post-op Assessment: Report given to RN, Post -op Vital signs reviewed and stable and Patient moving all extremities X 4  Post vital signs: stable  Last Vitals:  Vitals:   03/25/16 0513 03/25/16 1006  BP: 125/84 (!) 129/96  Pulse: 88 87  Resp: (!) 88 12  Temp: 36.8 C 37 C    Last Pain:  Vitals:   03/25/16 0513  TempSrc: Oral      Patients Stated Pain Goal: 4 ([id number])  Complications: No apparent anesthesia complications

## 2016-03-25 NOTE — Op Note (Signed)
Preoperative diagnosis: Clinically localized adenocarcinoma of the prostate (clinical stage T2a Nx Mx)  Postoperative diagnosis: Clinically localized adenocarcinoma of the prostate (clinical stage T2a Nx Mx)  Procedure:  1. Robotic assisted laparoscopic radical prostatectomy (right nerve sparing) 2. Bilateral robotic assisted laparoscopic pelvic lymphadenectomy  Surgeon: Pryor Curia. M.D.  Assistant(s): Debbrah Alar, PA-C  Anesthesia: General  Complications: None  EBL: 50 mL  IVF:  1500 mL crystalloid  Specimens: 1. Prostate and seminal vesicles 2. Right pelvic lymph nodes 3. Left pelvic lymph nodes  Disposition of specimens: Pathology  Drains: 1. 20 Fr coude catheter 2. # 19 Blake pelvic drain  Indication: Kyle Flowers is a 65 y.o. patient with clinically localized prostate cancer.  After a thorough review of the management options for treatment of prostate cancer, he elected to proceed with surgical therapy and the above procedure(s).  We have discussed the potential benefits and risks of the procedure, side effects of the proposed treatment, the likelihood of the patient achieving the goals of the procedure, and any potential problems that might occur during the procedure or recuperation. Informed consent has been obtained.  Description of procedure:  The patient was taken to the operating room and a general anesthetic was administered. He was given preoperative antibiotics, placed in the dorsal lithotomy position, and prepped and draped in the usual sterile fashion. Next a preoperative timeout was performed. A urethral catheter was placed into the bladder and a site was selected near the umbilicus for placement of the camera port. This was placed using a standard open Hassan technique which allowed entry into the peritoneal cavity under direct vision and without difficulty. An 8 mm port was placed and a pneumoperitoneum established. The camera was then used to  inspect the abdomen and there was no evidence of any intra-abdominal injuries or other abnormalities. The remaining abdominal ports were then placed. 8 mm robotic ports were placed in the right lower quadrant, left lower quadrant, and far left lateral abdominal wall. A 5 mm port was placed in the right upper quadrant and a 12 mm port was placed in the right lateral abdominal wall for laparoscopic assistance. All ports were placed under direct vision without difficulty. The surgical cart was then docked.   Utilizing the cautery scissors, the bladder was reflected posteriorly allowing entry into the space of Retzius and identification of the endopelvic fascia and prostate. The periprostatic fat was then removed from the prostate allowing full exposure of the endopelvic fascia. The endopelvic fascia was then incised from the apex back to the base of the prostate bilaterally and the underlying levator muscle fibers were swept laterally off the prostate thereby isolating the dorsal venous complex. The dorsal vein was then stapled and divided with a 45 mm Flex Echelon stapler. Attention then turned to the bladder neck which was divided anteriorly thereby allowing entry into the bladder and exposure of the urethral catheter. The catheter balloon was deflated and the catheter was brought into the operative field and used to retract the prostate anteriorly. The posterior bladder neck was then examined and was divided allowing further dissection between the bladder and prostate posteriorly until the vasa deferentia and seminal vessels were identified. The vasa deferentia were isolated, divided, and lifted anteriorly. The seminal vesicles were dissected down to their tips with care to control the seminal vascular arterial blood supply. These structures were then lifted anteriorly and the space between Denonvillier's fascia and the anterior rectum was developed with a combination of sharp  and blunt dissection. This isolated  the vascular pedicles of the prostate.  The lateral prostatic fascia on the right side of the prostate was then sharply incised allowing release of the neurovascular bundle. The vascular pedicle of the prostate on the right side was then ligated with Weck clips between the prostate and neurovascular bundle and divided with sharp cold scissor dissection resulting in neurovascular bundle preservation. On the left side, a wide non nerve sparing dissection was performed with Weck clips used to ligate the vascular pedicle of the prostate. The neurovascular bundle on the right side was then separated off the apex of the prostate and urethra.  The urethra was then sharply transected allowing the prostate specimen to be disarticulated. The pelvis was copiously irrigated and hemostasis was ensured. There was no evidence for rectal injury.  Attention then turned to the right pelvic sidewall. The fibrofatty tissue between the external iliac vein, confluence of the iliac vessels, hypogastric artery, and Cooper's ligament was dissected free from the pelvic sidewall with care to preserve the obturator nerve. Weck clips were used for lymphostasis and hemostasis. An identical procedure was performed on the contralateral side and the lymphatic packets were removed for permanent pathologic analysis.  Attention then turned to the urethral anastomosis. A 2-0 Vicryl slip knot was placed between Denonvillier's fascia, the posterior bladder neck, and the posterior urethra to reapproximate these structures. A double-armed 3-0 Monocryl suture was then used to perform a 360 running tension-free anastomosis between the bladder neck and urethra. A new urethral catheter was then placed into the bladder and irrigated. There were no blood clots within the bladder and the anastomosis appeared to be watertight. A #19 Blake drain was then brought through the left lateral 8 mm port site and positioned appropriately within the pelvis. It was  secured to the skin with a nylon suture. The surgical cart was then undocked. The right lateral 12 mm port site was closed at the fascial level with a 0 Vicryl suture placed laparoscopically. All remaining ports were then removed under direct vision. The prostate specimen was removed intact within the Endopouch retrieval bag via the periumbilical camera port site. This fascial opening was closed with two running 0 Vicryl sutures. 0.25% Marcaine was then injected into all port sites and all incisions were reapproximated at the skin level with 4-0 Monocryl subcuticular sutures. Dermabond was applied. The patient appeared to tolerate the procedure well and without complications. The patient was able to be extubated and transferred to the recovery unit in satisfactory condition.   Pryor Curia MD

## 2016-03-25 NOTE — Anesthesia Procedure Notes (Signed)
Procedure Name: Intubation Date/Time: 03/25/2016 7:30 AM Performed by: Lissa Morales Pre-anesthesia Checklist: Patient identified, Emergency Drugs available, Suction available and Patient being monitored Patient Re-evaluated:Patient Re-evaluated prior to inductionOxygen Delivery Method: Circle system utilized Preoxygenation: Pre-oxygenation with 100% oxygen Intubation Type: IV induction Ventilation: Mask ventilation without difficulty Tube type: Oral Tube size: 8.0 mm Number of attempts: 1 Airway Equipment and Method: Stylet and Oral airway Placement Confirmation: ETT inserted through vocal cords under direct vision,  positive ETCO2 and breath sounds checked- equal and bilateral Secured at: 23 cm Tube secured with: Tape Dental Injury: Teeth and Oropharynx as per pre-operative assessment

## 2016-03-26 LAB — HEMOGLOBIN AND HEMATOCRIT, BLOOD
HCT: 47.1 % (ref 39.0–52.0)
Hemoglobin: 15.5 g/dL (ref 13.0–17.0)

## 2016-03-26 MED ORDER — HYDROCODONE-ACETAMINOPHEN 5-325 MG PO TABS: 1.0000 | ORAL_TABLET | Freq: Four times a day (QID) | ORAL | Status: DC | PRN

## 2016-03-26 MED ORDER — BISACODYL 10 MG RE SUPP
10.0000 mg | Freq: Once | RECTAL | Status: AC
  Administered 2016-03-26: 10 mg via RECTAL
  Filled 2016-03-26: qty 1

## 2016-03-26 NOTE — Care Management Note (Signed)
Case Management Note  Patient Details  Name: Kyle Flowers MRN: [id number] Date of Birth: 26-Jan-1951  Subjective/Objective:                    Action/Plan:d/c home no needs or orders.   Expected Discharge Date:                  Expected Discharge Plan:  Home/Self Care  In-House Referral:     Discharge planning Services  CM Consult  Post Acute Care Choice:    Choice offered to:     DME Arranged:    DME Agency:     HH Arranged:    [place] Agency:     Status of Service:  Completed, signed off  If discussed at [employer] of Stay Meetings, dates discussed:    Additional Comments:  Dessa Phi, RN 03/26/2016, 2:17 PM

## 2016-03-26 NOTE — Progress Notes (Signed)
Patient ID: Kyle Flowers, male   DOB: 1950-10-27, 65 y.o.   MRN: [id number]  1 Day Post-Op Subjective: The patient is doing well.  No nausea or vomiting. Pain is adequately controlled.  Objective: Vital signs in last 24 hours: Temp:  [97.6 F (36.4 C)-99.1 F (37.3 C)] 98.7 F (37.1 C) (08/29 0654) Pulse Rate:  [85-115] 85 (08/29 0654) Resp:  [11-16] 16 (08/29 0654) BP: (110-134)/(70-96) 116/73 (08/29 0654) SpO2:  [95 %-97 %] 95 % (08/29 0654)  Intake/Output from previous day: 08/28 0701 - 08/29 0700 In: 3612.5 [P.O.:360; I.V.:3202.5; IV Piggyback:50] Out: [id number] [Urine:3325; Drains:130; Blood:50] Intake/Output this shift: No intake/output data recorded.  Physical Exam:  General: Alert and oriented. CV: RRR Lungs: Clear bilaterally. GI: Soft, Nondistended. Incisions: Clean, dry, and intact Urine: Clear Extremities: Nontender, no erythema, no edema.  Lab Results:  Recent Labs  03/25/16 1044 03/26/16 0534  HGB 14.8 15.5  HCT 44.9 47.1      Assessment/Plan: POD# 1 s/p robotic prostatectomy.  1) SL IVF 2) Ambulate, Incentive spirometry 3) Transition to oral pain medication 4) Dulcolax suppository 5) D/C pelvic drain 6) Plan for likely discharge later today   Pryor Curia. MD   LOS: 1 day   Kyle Flowers,LES 03/26/2016, 7:53 AM

## 2016-03-26 NOTE — Discharge Summary (Signed)
  Date of admission: 03/25/2016  Date of discharge: 03/26/2016  Admission diagnosis: Prostate Cancer  Discharge diagnosis: Prostate Cancer  History and Physical: For full details, please see admission history and physical. Briefly, Kyle Flowers is a 65 y.o. gentleman with localized prostate cancer.  After discussing management/treatment options, he elected to proceed with surgical treatment.  Hospital Course: Kyle Flowers was taken to the operating room on 03/25/2016 and underwent a robotic assisted laparoscopic radical prostatectomy. He tolerated this procedure well and without complications. Postoperatively, he was able to be transferred to a regular hospital room following recovery from anesthesia.  He was able to begin ambulating the night of surgery. He remained hemodynamically stable overnight.  He had excellent urine output with appropriately minimal output from his pelvic drain and his pelvic drain was removed on POD #1.  He was transitioned to oral pain medication, tolerated a clear liquid diet, and had met all discharge criteria and was able to be discharged home later on POD#1.  Laboratory values:  Recent Labs  03/25/16 1044 03/26/16 0534  HGB 14.8 15.5  HCT 44.9 47.1    Disposition: Home  Discharge instruction: He was instructed to be ambulatory but to refrain from heavy lifting, strenuous activity, or driving. He was instructed on urethral catheter care.  Discharge medications:    Medication List    STOP taking these medications   aspirin EC 81 MG tablet     TAKE these medications   HYDROcodone-acetaminophen 5-325 MG tablet Commonly known as:  NORCO Take 1-2 tablets by mouth every 6 (six) hours as needed for moderate pain.   lisinopril 20 MG tablet Commonly known as:  PRINIVIL,ZESTRIL Take 20 mg by mouth daily. evening   lovastatin 40 MG tablet Commonly known as:  MEVACOR Take 40 mg by mouth at bedtime.   sulfamethoxazole-trimethoprim 800-160 MG  tablet Commonly known as:  BACTRIM DS,SEPTRA DS Take 1 tablet by mouth 2 (two) times daily. Start the day prior to foley removal appointment       Followup: He will followup in 1 week for catheter removal and to discuss his surgical pathology results.

## 2016-04-22 DIAGNOSIS — M6281 Muscle weakness (generalized): Secondary | ICD-10-CM | POA: Diagnosis not present

## 2016-04-22 DIAGNOSIS — C61 Malignant neoplasm of prostate: Secondary | ICD-10-CM | POA: Diagnosis not present

## 2016-04-22 DIAGNOSIS — N393 Stress incontinence (female) (male): Secondary | ICD-10-CM | POA: Diagnosis not present

## 2016-05-06 DIAGNOSIS — C61 Malignant neoplasm of prostate: Secondary | ICD-10-CM | POA: Diagnosis not present

## 2016-05-06 DIAGNOSIS — G609 Hereditary and idiopathic neuropathy, unspecified: Secondary | ICD-10-CM | POA: Diagnosis not present

## 2016-05-06 DIAGNOSIS — M79605 Pain in left leg: Secondary | ICD-10-CM | POA: Diagnosis not present

## 2016-05-14 DIAGNOSIS — M25562 Pain in left knee: Secondary | ICD-10-CM | POA: Diagnosis not present

## 2016-05-15 DIAGNOSIS — C61 Malignant neoplasm of prostate: Secondary | ICD-10-CM | POA: Diagnosis not present

## 2016-05-15 DIAGNOSIS — M6281 Muscle weakness (generalized): Secondary | ICD-10-CM | POA: Diagnosis not present

## 2016-05-15 DIAGNOSIS — R278 Other lack of coordination: Secondary | ICD-10-CM | POA: Diagnosis not present

## 2016-05-15 DIAGNOSIS — N393 Stress incontinence (female) (male): Secondary | ICD-10-CM | POA: Diagnosis not present

## 2016-06-11 DIAGNOSIS — I10 Essential (primary) hypertension: Secondary | ICD-10-CM | POA: Diagnosis not present

## 2016-06-11 DIAGNOSIS — C61 Malignant neoplasm of prostate: Secondary | ICD-10-CM | POA: Diagnosis not present

## 2016-06-11 DIAGNOSIS — E782 Mixed hyperlipidemia: Secondary | ICD-10-CM | POA: Diagnosis not present

## 2016-06-11 DIAGNOSIS — Z0000 Encounter for general adult medical examination without abnormal findings: Secondary | ICD-10-CM | POA: Diagnosis not present

## 2016-06-11 DIAGNOSIS — Z79899 Other long term (current) drug therapy: Secondary | ICD-10-CM | POA: Diagnosis not present

## 2016-06-11 DIAGNOSIS — R7309 Other abnormal glucose: Secondary | ICD-10-CM | POA: Diagnosis not present

## 2016-07-05 DIAGNOSIS — C61 Malignant neoplasm of prostate: Secondary | ICD-10-CM | POA: Diagnosis not present

## 2016-07-12 DIAGNOSIS — N393 Stress incontinence (female) (male): Secondary | ICD-10-CM | POA: Diagnosis not present

## 2016-07-12 DIAGNOSIS — C61 Malignant neoplasm of prostate: Secondary | ICD-10-CM | POA: Diagnosis not present

## 2017-01-08 DIAGNOSIS — C61 Malignant neoplasm of prostate: Secondary | ICD-10-CM | POA: Diagnosis not present

## 2017-01-15 DIAGNOSIS — C61 Malignant neoplasm of prostate: Secondary | ICD-10-CM | POA: Diagnosis not present

## 2017-06-27 DIAGNOSIS — K469 Unspecified abdominal hernia without obstruction or gangrene: Secondary | ICD-10-CM | POA: Diagnosis not present

## 2017-07-21 DIAGNOSIS — Z1159 Encounter for screening for other viral diseases: Secondary | ICD-10-CM | POA: Diagnosis not present

## 2017-07-21 DIAGNOSIS — I10 Essential (primary) hypertension: Secondary | ICD-10-CM | POA: Diagnosis not present

## 2017-07-21 DIAGNOSIS — E782 Mixed hyperlipidemia: Secondary | ICD-10-CM | POA: Diagnosis not present

## 2017-08-06 DIAGNOSIS — C61 Malignant neoplasm of prostate: Secondary | ICD-10-CM | POA: Diagnosis not present

## 2017-08-13 DIAGNOSIS — C61 Malignant neoplasm of prostate: Secondary | ICD-10-CM | POA: Diagnosis not present

## 2018-03-04 DIAGNOSIS — C61 Malignant neoplasm of prostate: Secondary | ICD-10-CM | POA: Diagnosis not present

## 2018-03-11 DIAGNOSIS — K469 Unspecified abdominal hernia without obstruction or gangrene: Secondary | ICD-10-CM | POA: Diagnosis not present

## 2018-03-11 DIAGNOSIS — C61 Malignant neoplasm of prostate: Secondary | ICD-10-CM | POA: Diagnosis not present

## 2018-08-21 DIAGNOSIS — I10 Essential (primary) hypertension: Secondary | ICD-10-CM | POA: Diagnosis not present

## 2018-08-21 DIAGNOSIS — Z125 Encounter for screening for malignant neoplasm of prostate: Secondary | ICD-10-CM | POA: Diagnosis not present

## 2018-08-21 DIAGNOSIS — E782 Mixed hyperlipidemia: Secondary | ICD-10-CM | POA: Diagnosis not present

## 2018-08-21 DIAGNOSIS — Z1321 Encounter for screening for nutritional disorder: Secondary | ICD-10-CM | POA: Diagnosis not present

## 2018-08-21 DIAGNOSIS — Z0000 Encounter for general adult medical examination without abnormal findings: Secondary | ICD-10-CM | POA: Diagnosis not present

## 2018-09-09 DIAGNOSIS — C61 Malignant neoplasm of prostate: Secondary | ICD-10-CM | POA: Diagnosis not present

## 2018-09-16 DIAGNOSIS — C61 Malignant neoplasm of prostate: Secondary | ICD-10-CM | POA: Diagnosis not present

## 2018-09-16 DIAGNOSIS — K469 Unspecified abdominal hernia without obstruction or gangrene: Secondary | ICD-10-CM | POA: Diagnosis not present

## 2019-01-25 ENCOUNTER — Ambulatory Visit: Payer: Self-pay | Admitting: Surgery

## 2019-01-25 DIAGNOSIS — K432 Incisional hernia without obstruction or gangrene: Secondary | ICD-10-CM | POA: Diagnosis not present

## 2019-01-26 ENCOUNTER — Ambulatory Visit: Payer: Self-pay | Admitting: Surgery

## 2019-01-26 NOTE — H&P (View-Only) (Signed)
Kyle Flowers Documented: 01/25/2019 2:09 PM Location: [facility] Patient #: [id number] DOB: [birth year] Undefined / Language: Kyle Flowers / Race: White Male  History of Present Illness Kyle Hector MD; 01/26/2019 8:14 AM) The patient is a 68 year old male who presents with an incisional hernia. Note for "Incisional hernia": ` ` ` Patient sent for surgical consultation at the request of Dr Dutch Gray, [facility]  Chief Complaint: Enlarging bulge concerning for incisional hernia ` ` The patient is a pleasant gentleman from [place]. Required a robotic prostatectomy 2017. History of appendectomy when is a teenager. He's noticed some bulging at his extraction incision above his belly button. Followed by urology. He has not been too bothersome but is gradually increased in size. His wife's need a mallet joint surgeries this past year or so is held off on any intervention. She is more recovered and now he notices its beginning larger. He wish to reconsider repair. Surgical consultation requested. He does not smoke. He is not diabetic. He takes a low dose aspirin a day. Does not have major cardiac issues. He can walk about 15 minutes before he has to stop. He moves his bowels every day. No history of infections or MRSA.  No personal nor family history of GI/colon cancer, inflammatory bowel disease, irritable bowel syndrome, allergy such as Celiac Sprue, dietary/dairy problems, colitis, ulcers nor gastritis. No recent sick contacts/gastroenteritis. No travel outside the country. No changes in diet. No dysphagia to solids or liquids. No significant heartburn or reflux. No hematochezia, hematemesis, coffee ground emesis. No evidence of prior gastric/peptic ulceration.   (Review of systems as stated in this history (HPI) or in the review of systems. Otherwise all other 12 point ROS are negative) ` ` `   Past Surgical History Nance Pew, CMA; 01/25/2019 2:10  PM) TURP  Diagnostic Studies History Nance Pew, CMA; 01/25/2019 2:10 PM) Colonoscopy 1-5 years ago  Allergies Nance Pew, CMA; 01/25/2019 2:10 PM) No Known Allergies [01/25/2019]: No Known Drug Allergies [01/25/2019]: Allergies Reconciled  Medication History Nance Pew, CMA; 01/25/2019 2:10 PM) Lisinopril (20MG  Tablet, Oral) Active. Lovastatin (40MG  Tablet, Oral) Active. Vitamin D (Ergocalciferol) (1.25 MG(50000 UT) Capsule, Oral) Active. Medications Reconciled  Social History Nance Pew, CMA; 01/25/2019 2:10 PM) No drug use Tobacco use Never smoker.  Other Problems Nance Pew, CMA; 01/25/2019 2:10 PM) Hypercholesterolemia Prostate Cancer     Review of Systems ([place]; 01/25/2019 2:10 PM) Skin Not Present- Change in Wart/Mole, Dryness, Hives, Jaundice, New Lesions, Non-Healing Wounds, Rash and Ulcer. Breast Not Present- Breast Mass, Breast Pain, Nipple Discharge and Skin Changes. Hematology Not Present- Blood Thinners, Easy Bruising, Excessive bleeding, Gland problems, HIV and Persistent Infections.  Vitals (Sabrina Canty CMA; 01/25/2019 2:11 PM) 01/25/2019 2:11 PM Weight: 216 lb Height: 69in Body Surface Area: 2.13 m Body Mass Index: 31.9 kg/m  Temp.: 97.45F(Temporal)  Pulse: 104 (Regular)  BP: 148/88 (Sitting, Left Arm, Standard)        Physical Exam Kyle Hector MD; 01/26/2019 8:07 AM)  General Mental Status-Alert. General Appearance-Not in acute distress, Not Sickly. Orientation-Oriented X3. Hydration-Well hydrated. Voice-Normal.  Integumentary Global Assessment Upon inspection and palpation of skin surfaces of the - Axillae: non-tender, no inflammation or ulceration, no drainage. and Distribution of scalp and body hair is normal. General Characteristics Temperature - normal warmth is noted.  Head and Neck Head-normocephalic, atraumatic with no lesions or palpable masses. Face Global  Assessment - atraumatic, no absence of expression. Neck Global Assessment - no abnormal  movements, no bruit auscultated on the right, no bruit auscultated on the left, no decreased range of motion, non-tender. Trachea-midline. Thyroid Gland Characteristics - non-tender.  Eye Eyeball - Left-Extraocular movements intact, No Nystagmus - Left. Eyeball - Right-Extraocular movements intact, No Nystagmus - Right. Cornea - Left-No Hazy - Left. Cornea - Right-No Hazy - Right. Sclera/Conjunctiva - Left-No scleral icterus, No Discharge - Left. Sclera/Conjunctiva - Right-No scleral icterus, No Discharge - Right. Pupil - Left-Direct reaction to light normal. Pupil - Right-Direct reaction to light normal. Note: Wears glasses. Vision corrected  ENMT Ears Pinna - Left - no drainage observed, no generalized tenderness observed. Pinna - Right - no drainage observed, no generalized tenderness observed. Nose and Sinuses External Inspection of the Nose - no destructive lesion observed. Inspection of the nares - Left - quiet respiration. Inspection of the nares - Right - quiet respiration. Mouth and Throat Lips - Upper Lip - no fissures observed, no pallor noted. Lower Lip - no fissures observed, no pallor noted. Nasopharynx - no discharge present. Oral Cavity/Oropharynx - Tongue - no dryness observed. Oral Mucosa - no cyanosis observed. Hypopharynx - no evidence of airway distress observed.  Chest and Lung Exam Inspection Movements - Normal and Symmetrical. Accessory muscles - No use of accessory muscles in breathing. Palpation Palpation of the chest reveals - Non-tender. Auscultation Breath sounds - Normal and Clear.  Cardiovascular Auscultation Rhythm - Regular. Murmurs & Other Heart Sounds - Auscultation of the heart reveals - No Murmurs and No Systolic Clicks.  Abdomen Inspection Inspection of the abdomen reveals - No Visible peristalsis and No Abnormal pulsations. Umbilicus  - No Bleeding, No Urine drainage. Palpation/Percussion Palpation and Percussion of the abdomen reveal - Soft, Non Tender, No Rebound tenderness, No Rigidity (guarding) and No Cutaneous hyperesthesia. Note: Obvious right periumbilical bulging 7 x 7 cm reduces down to 4 x 3 cm fascial defect consistent with reducible incisional hernia.  Abdomen obese but soft. Nontender. Not distended. No other hernias. No guarding.  Male Genitourinary Sexual Maturity Tanner 5 - Adult hair pattern and Adult penile size and shape.  Peripheral Vascular Upper Extremity Inspection - Left - No Cyanotic nailbeds - Left, Not Ischemic. Inspection - Right - No Cyanotic nailbeds - Right, Not Ischemic.  Neurologic Neurologic evaluation reveals -normal attention span and ability to concentrate, able to name objects and repeat phrases. Appropriate fund of knowledge , normal sensation and normal coordination. Mental Status Affect - not angry, not paranoid. Cranial Nerves-Normal Bilaterally. Gait-Normal.  Neuropsychiatric Mental status exam performed with findings of-able to articulate well with normal speech/language, rate, volume and coherence, thought content normal with ability to perform basic computations and apply abstract reasoning and no evidence of hallucinations, delusions, obsessions or homicidal/suicidal ideation.  Musculoskeletal Global Assessment Spine, Ribs and Pelvis - no instability, subluxation or laxity. Right Upper Extremity - no instability, subluxation or laxity.  Lymphatic Head & Neck  General Head & Neck Lymphatics: Bilateral - Description - No Localized lymphadenopathy. Axillary  General Axillary Region: Bilateral - Description - No Localized lymphadenopathy. Femoral & Inguinal  Generalized Femoral & Inguinal Lymphatics: Left - Description - No Localized lymphadenopathy. Right - Description - No Localized lymphadenopathy.    Assessment & Plan Kyle Hector MD;  01/25/2019 5:43 PM)  INCISIONAL HERNIA, WITHOUT OBSTRUCTION OR GANGRENE (K43.2) Impression: Pleasant obese gentleman with enlarging hernia at extraction site from his prior radical prostatectomy in 2017.  I think he would benefit from surgical repair. Underlay mesh. Possible overnight stay. He is  interested in proceeding.   PREOP - [place] - ENCOUNTER FOR PREOPERATIVE EXAMINATION FOR GENERAL SURGICAL PROCEDURE (Z01.818)  Current Plans You are being scheduled for surgery- Our schedulers will call you.  You should hear from our office's scheduling department within 5 working days about the location, date, and time of surgery. We try to make accommodations for patient's preferences in scheduling surgery, but sometimes the OR schedule or the surgeon's schedule prevents [place] from making those accommodations.  If you have not heard from our office [phone]) in 5 working days, call the office and ask for your surgeon's nurse.  If you have other questions about your diagnosis, plan, or surgery, call the office and ask for your surgeon's nurse.  Written instructions provided The anatomy & physiology of the abdominal wall was discussed. The pathophysiology of hernias was discussed. Natural history risks without surgery including progeressive enlargement, pain, incarceration, & strangulation was discussed. Contributors to complications such as smoking, obesity, diabetes, prior surgery, etc were discussed.  I feel the risks of no intervention will lead to serious problems that outweigh the operative risks; therefore, I recommended surgery to reduce and repair the hernia. I explained laparoscopic techniques with possible need for an open approach. I noted the probable use of mesh to patch and/or buttress the hernia repair  Risks such as bleeding, infection, abscess, need for further treatment, heart attack, death, and other risks were discussed. I noted a good likelihood this will help address the  problem. Goals of post-operative recovery were discussed as well. Possibility that this will not correct all symptoms was explained. I stressed the importance of low-impact activity, aggressive pain control, avoiding constipation, & not pushing through pain to minimize risk of post-operative chronic pain or injury. Possibility of reherniation especially with smoking, obesity, diabetes, immunosuppression, and other health conditions was discussed. We will work to minimize complications.  An educational handout further explaining the pathology & treatment options was given as well. Questions were answered. The patient expresses understanding & wishes to proceed with surgery.  Pt Education - [facility] Hernia Post-Op HCI (Devri Kreher): discussed with patient and provided information. Pt Education - [facility] Pain Control (Graylyn Bunney) Pt Education - Pamphlet Given - Laparoscopic Hernia Repair: discussed with patient and provided information. Pt Education - [facility] Mesh education: discussed with patient and provided information.  Kyle Hector, MD, FACS, MASCRS Gastrointestinal and Minimally Invasive Surgery    [address] [place], [place] [address] [phone] Main / Paging [phone] Fax

## 2019-01-26 NOTE — H&P (Signed)
Kyle Flowers Documented: 01/25/2019 2:09 PM Location: [facility] Patient #: [id number] DOB: Dec 02, 1950 Undefined / Language: [name] / Race: White Male  History of Present Illness [name] MD; 01/26/2019 8:14 AM) The patient is a 68 year old male who presents with an incisional hernia. Note for "Incisional hernia": ` ` ` Patient sent for surgical consultation at the request of Dr Dutch Gray, [facility]  Chief Complaint: Enlarging bulge concerning for incisional hernia ` ` The patient is a pleasant gentleman from [place]. Required a robotic prostatectomy 2017. History of appendectomy when is a teenager. He's noticed some bulging at his extraction incision above his belly button. Followed by urology. He has not been too bothersome but is gradually increased in size. His wife's need a mallet joint surgeries this past year or so is held off on any intervention. She is more recovered and now he notices its beginning larger. He wish to reconsider repair. Surgical consultation requested. He does not smoke. He is not diabetic. He takes a low dose aspirin a day. Does not have major cardiac issues. He can walk about 15 minutes before he has to stop. He moves his bowels every day. No history of infections or MRSA.  No personal nor family history of GI/colon cancer, inflammatory bowel disease, irritable bowel syndrome, allergy such as Celiac Sprue, dietary/dairy problems, colitis, ulcers nor gastritis. No recent sick contacts/gastroenteritis. No travel outside the country. No changes in diet. No dysphagia to solids or liquids. No significant heartburn or reflux. No hematochezia, hematemesis, coffee ground emesis. No evidence of prior gastric/peptic ulceration.   (Review of systems as stated in this history (HPI) or in the review of systems. Otherwise all other 12 point ROS are negative) ` ` `   Past Surgical History Nance Pew, CMA; 01/25/2019 2:10  PM) TURP  Diagnostic Studies History Nance Pew, CMA; 01/25/2019 2:10 PM) Colonoscopy 1-5 years ago  Allergies Nance Pew, CMA; 01/25/2019 2:10 PM) No Known Allergies [01/25/2019]: No Known Drug Allergies [01/25/2019]: Allergies Reconciled  Medication History Nance Pew, CMA; 01/25/2019 2:10 PM) Lisinopril (20MG  Tablet, Oral) Active. Lovastatin (40MG  Tablet, Oral) Active. Vitamin D (Ergocalciferol) (1.25 MG(50000 UT) Capsule, Oral) Active. Medications Reconciled  Social History Nance Pew, CMA; 01/25/2019 2:10 PM) No drug use Tobacco use Never smoker.  Other Problems Nance Pew, CMA; 01/25/2019 2:10 PM) Hypercholesterolemia Prostate Cancer     Review of Systems (Winter; 01/25/2019 2:10 PM) Skin Not Present- Change in Wart/Mole, Dryness, Hives, Jaundice, New Lesions, Non-Healing Wounds, Rash and Ulcer. Breast Not Present- Breast Mass, Breast Pain, Nipple Discharge and Skin Changes. Hematology Not Present- Blood Thinners, Easy Bruising, Excessive bleeding, Gland problems, HIV and Persistent Infections.  Vitals (Sabrina Canty CMA; 01/25/2019 2:11 PM) 01/25/2019 2:11 PM Weight: 216 lb Height: 69in Body Surface Area: 2.13 m Body Mass Index: 31.9 kg/m  Temp.: 97.74F(Temporal)  Pulse: 104 (Regular)  BP: 148/88 (Sitting, Left Arm, Standard)        Physical Exam [name] MD; 01/26/2019 8:07 AM)  General Mental Status-Alert. General Appearance-Not in acute distress, Not Sickly. Orientation-Oriented X3. Hydration-Well hydrated. Voice-Normal.  Integumentary Global Assessment Upon inspection and palpation of skin surfaces of the - Axillae: non-tender, no inflammation or ulceration, no drainage. and Distribution of scalp and body hair is normal. General Characteristics Temperature - normal warmth is noted.  Head and Neck Head-normocephalic, atraumatic with no lesions or palpable masses. Face Global  Assessment - atraumatic, no absence of expression. Neck Global Assessment - no abnormal  movements, no bruit auscultated on the right, no bruit auscultated on the left, no decreased range of motion, non-tender. Trachea-midline. Thyroid Gland Characteristics - non-tender.  Eye Eyeball - Left-Extraocular movements intact, No Nystagmus - Left. Eyeball - Right-Extraocular movements intact, No Nystagmus - Right. Cornea - Left-No Hazy - Left. Cornea - Right-No Hazy - Right. Sclera/Conjunctiva - Left-No scleral icterus, No Discharge - Left. Sclera/Conjunctiva - Right-No scleral icterus, No Discharge - Right. Pupil - Left-Direct reaction to light normal. Pupil - Right-Direct reaction to light normal. Note: Wears glasses. Vision corrected  ENMT Ears Pinna - Left - no drainage observed, no generalized tenderness observed. Pinna - Right - no drainage observed, no generalized tenderness observed. Nose and Sinuses External Inspection of the Nose - no destructive lesion observed. Inspection of the nares - Left - quiet respiration. Inspection of the nares - Right - quiet respiration. Mouth and Throat Lips - Upper Lip - no fissures observed, no pallor noted. Lower Lip - no fissures observed, no pallor noted. Nasopharynx - no discharge present. Oral Cavity/Oropharynx - Tongue - no dryness observed. Oral Mucosa - no cyanosis observed. Hypopharynx - no evidence of airway distress observed.  Chest and Lung Exam Inspection Movements - Normal and Symmetrical. Accessory muscles - No use of accessory muscles in breathing. Palpation Palpation of the chest reveals - Non-tender. Auscultation Breath sounds - Normal and Clear.  Cardiovascular Auscultation Rhythm - Regular. Murmurs & Other Heart Sounds - Auscultation of the heart reveals - No Murmurs and No Systolic Clicks.  Abdomen Inspection Inspection of the abdomen reveals - No Visible peristalsis and No Abnormal pulsations. Umbilicus  - No Bleeding, No Urine drainage. Palpation/Percussion Palpation and Percussion of the abdomen reveal - Soft, Non Tender, No Rebound tenderness, No Rigidity (guarding) and No Cutaneous hyperesthesia. Note: Obvious right periumbilical bulging 7 x 7 cm reduces down to 4 x 3 cm fascial defect consistent with reducible incisional hernia.  Abdomen obese but soft. Nontender. Not distended. No other hernias. No guarding.  Male Genitourinary Sexual Maturity Tanner 5 - Adult hair pattern and Adult penile size and shape.  Peripheral Vascular Upper Extremity Inspection - Left - No Cyanotic nailbeds - Left, Not Ischemic. Inspection - Right - No Cyanotic nailbeds - Right, Not Ischemic.  Neurologic Neurologic evaluation reveals -normal attention span and ability to concentrate, able to name objects and repeat phrases. Appropriate fund of knowledge , normal sensation and normal coordination. Mental Status Affect - not angry, not paranoid. Cranial Nerves-Normal Bilaterally. Gait-Normal.  Neuropsychiatric Mental status exam performed with findings of-able to articulate well with normal speech/language, rate, volume and coherence, thought content normal with ability to perform basic computations and apply abstract reasoning and no evidence of hallucinations, delusions, obsessions or homicidal/suicidal ideation.  Musculoskeletal Global Assessment Spine, Ribs and Pelvis - no instability, subluxation or laxity. Right Upper Extremity - no instability, subluxation or laxity.  Lymphatic Head & Neck  General Head & Neck Lymphatics: Bilateral - Description - No Localized lymphadenopathy. Axillary  General Axillary Region: Bilateral - Description - No Localized lymphadenopathy. Femoral & Inguinal  Generalized Femoral & Inguinal Lymphatics: Left - Description - No Localized lymphadenopathy. Right - Description - No Localized lymphadenopathy.    Assessment & Plan [name] MD;  01/25/2019 5:43 PM)  INCISIONAL HERNIA, WITHOUT OBSTRUCTION OR GANGRENE (K43.2) Impression: Pleasant obese gentleman with enlarging hernia at extraction site from his prior radical prostatectomy in 2017.  I think he would benefit from surgical repair. Underlay mesh. Possible overnight stay. He is  interested in proceeding.   PREOP - [place] - ENCOUNTER FOR PREOPERATIVE EXAMINATION FOR GENERAL SURGICAL PROCEDURE (Z01.818)  Current Plans You are being scheduled for surgery- Our schedulers will call you.  You should hear from our office's scheduling department within 5 working days about the location, date, and time of surgery. We try to make accommodations for patient's preferences in scheduling surgery, but sometimes the OR schedule or the surgeon's schedule prevents [place] from making those accommodations.  If you have not heard from our office [phone]) in 5 working days, call the office and ask for your surgeon's nurse.  If you have other questions about your diagnosis, plan, or surgery, call the office and ask for your surgeon's nurse.  Written instructions provided The anatomy & physiology of the abdominal wall was discussed. The pathophysiology of hernias was discussed. Natural history risks without surgery including progeressive enlargement, pain, incarceration, & strangulation was discussed. Contributors to complications such as smoking, obesity, diabetes, prior surgery, etc were discussed.  I feel the risks of no intervention will lead to serious problems that outweigh the operative risks; therefore, I recommended surgery to reduce and repair the hernia. I explained laparoscopic techniques with possible need for an open approach. I noted the probable use of mesh to patch and/or buttress the hernia repair  Risks such as bleeding, infection, abscess, need for further treatment, heart attack, death, and other risks were discussed. I noted a good likelihood this will help address the  problem. Goals of post-operative recovery were discussed as well. Possibility that this will not correct all symptoms was explained. I stressed the importance of low-impact activity, aggressive pain control, avoiding constipation, & not pushing through pain to minimize risk of post-operative chronic pain or injury. Possibility of reherniation especially with smoking, obesity, diabetes, immunosuppression, and other health conditions was discussed. We will work to minimize complications.  An educational handout further explaining the pathology & treatment options was given as well. Questions were answered. The patient expresses understanding & wishes to proceed with surgery.  Pt Education - [facility] Hernia Post-Op HCI (Sai Zinn): discussed with patient and provided information. Pt Education - [facility] Pain Control (Jamel Dunton) Pt Education - Pamphlet Given - Laparoscopic Hernia Repair: discussed with patient and provided information. Pt Education - [facility] Mesh education: discussed with patient and provided information.  [name], MD, FACS, MASCRS Gastrointestinal and Minimally Invasive Surgery    [address], [place], [place] [address] [phone] Main / Paging [phone] Fax

## 2019-02-16 ENCOUNTER — Other Ambulatory Visit (HOSPITAL_COMMUNITY)
Admission: RE | Admit: 2019-02-16 | Discharge: 2019-02-16 | Disposition: A | Discharge status: Home / Self Care | Payer: Medicare HMO | Source: Ambulatory Visit | Attending: Surgery | Admitting: Surgery

## 2019-02-16 DIAGNOSIS — Z1159 Encounter for screening for other viral diseases: Secondary | ICD-10-CM | POA: Diagnosis not present

## 2019-02-16 LAB — SARS CORONAVIRUS 2 (TAT 6-24 HRS): SARS Coronavirus 2: NEGATIVE

## 2019-02-17 ENCOUNTER — Encounter (HOSPITAL_BASED_OUTPATIENT_CLINIC_OR_DEPARTMENT_OTHER): Payer: Self-pay | Admitting: *Deleted

## 2019-02-17 ENCOUNTER — Other Ambulatory Visit: Payer: Self-pay

## 2019-02-17 NOTE — Progress Notes (Addendum)
Spoke w/ pt wife, Kyle Flowers, via phone for pre-op interview.  Npo after mn.  Arrive at 0730.  Needs istat and ekg.  Pt had covid test done 02-16-2019.  Pt hoh.  Per dr tgross order pt continues asa, wife verbalized understanding.

## 2019-02-19 ENCOUNTER — Encounter (HOSPITAL_BASED_OUTPATIENT_CLINIC_OR_DEPARTMENT_OTHER): Payer: Self-pay | Admitting: *Deleted

## 2019-02-19 ENCOUNTER — Ambulatory Visit (HOSPITAL_BASED_OUTPATIENT_CLINIC_OR_DEPARTMENT_OTHER): Payer: Medicare HMO | Admitting: Certified Registered"

## 2019-02-19 ENCOUNTER — Ambulatory Visit (HOSPITAL_BASED_OUTPATIENT_CLINIC_OR_DEPARTMENT_OTHER)
Admission: RE | Admit: 2019-02-19 | Discharge: 2019-02-19 | Disposition: A | Discharge status: Home / Self Care | Payer: Medicare HMO | Attending: Surgery | Admitting: Surgery

## 2019-02-19 ENCOUNTER — Encounter (HOSPITAL_BASED_OUTPATIENT_CLINIC_OR_DEPARTMENT_OTHER)
Admission: RE | Disposition: A | Discharge status: Home / Self Care | Payer: Self-pay | Source: Home / Self Care | Attending: Surgery

## 2019-02-19 ENCOUNTER — Other Ambulatory Visit: Payer: Self-pay

## 2019-02-19 DIAGNOSIS — E782 Mixed hyperlipidemia: Secondary | ICD-10-CM | POA: Insufficient documentation

## 2019-02-19 DIAGNOSIS — Z7982 Long term (current) use of aspirin: Secondary | ICD-10-CM | POA: Diagnosis not present

## 2019-02-19 DIAGNOSIS — Z6830 Body mass index (BMI) 30.0-30.9, adult: Secondary | ICD-10-CM | POA: Diagnosis not present

## 2019-02-19 DIAGNOSIS — I10 Essential (primary) hypertension: Secondary | ICD-10-CM | POA: Diagnosis not present

## 2019-02-19 DIAGNOSIS — Z79899 Other long term (current) drug therapy: Secondary | ICD-10-CM | POA: Diagnosis not present

## 2019-02-19 DIAGNOSIS — Z8546 Personal history of malignant neoplasm of prostate: Secondary | ICD-10-CM | POA: Insufficient documentation

## 2019-02-19 DIAGNOSIS — E669 Obesity, unspecified: Secondary | ICD-10-CM | POA: Insufficient documentation

## 2019-02-19 DIAGNOSIS — K432 Incisional hernia without obstruction or gangrene: Secondary | ICD-10-CM

## 2019-02-19 DIAGNOSIS — C61 Malignant neoplasm of prostate: Secondary | ICD-10-CM | POA: Diagnosis not present

## 2019-02-19 HISTORY — DX: Ventral hernia without obstruction or gangrene: K43.9

## 2019-02-19 HISTORY — DX: Mixed hyperlipidemia: E78.2

## 2019-02-19 HISTORY — DX: Presence of dental prosthetic device (complete) (partial): Z97.2

## 2019-02-19 HISTORY — DX: Presence of spectacles and contact lenses: Z97.3

## 2019-02-19 HISTORY — DX: Presence of external hearing-aid: Z97.4

## 2019-02-19 HISTORY — DX: Personal history of malignant neoplasm of prostate: Z85.46

## 2019-02-19 HISTORY — PX: VENTRAL HERNIA REPAIR: SHX424

## 2019-02-19 LAB — POCT I-STAT 4, (NA,K, GLUC, HGB,HCT)
Glucose, Bld: 101 mg/dL — ABNORMAL HIGH (ref 70–99)
HCT: 50 % (ref 39.0–52.0)
Hemoglobin: 17 g/dL (ref 13.0–17.0)
Potassium: 4.1 mmol/L (ref 3.5–5.1)
Sodium: 141 mmol/L (ref 135–145)

## 2019-02-19 SURGERY — REPAIR, HERNIA, VENTRAL, LAPAROSCOPIC
Anesthesia: General | Site: Abdomen

## 2019-02-19 MED ORDER — GABAPENTIN 300 MG PO CAPS
300.0000 mg | ORAL_CAPSULE | ORAL | Status: AC
  Administered 2019-02-19: 300 mg via ORAL
  Filled 2019-02-19: qty 1

## 2019-02-19 MED ORDER — LIDOCAINE 2% (20 MG/ML) 5 ML SYRINGE
INTRAMUSCULAR | Status: DC | PRN
  Administered 2019-02-19: 60 mg via INTRAVENOUS

## 2019-02-19 MED ORDER — LIDOCAINE 2% (20 MG/ML) 5 ML SYRINGE
INTRAMUSCULAR | Status: AC
  Filled 2019-02-19: qty 10

## 2019-02-19 MED ORDER — SUCCINYLCHOLINE CHLORIDE 200 MG/10ML IV SOSY
PREFILLED_SYRINGE | INTRAVENOUS | Status: DC | PRN
  Administered 2019-02-19: 120 mg via INTRAVENOUS

## 2019-02-19 MED ORDER — SUCCINYLCHOLINE CHLORIDE 200 MG/10ML IV SOSY
PREFILLED_SYRINGE | INTRAVENOUS | Status: AC
  Filled 2019-02-19: qty 10

## 2019-02-19 MED ORDER — BUPIVACAINE LIPOSOME 1.3 % IJ SUSP
20.0000 mL | Freq: Once | INTRAMUSCULAR | Status: DC
  Filled 2019-02-19: qty 20

## 2019-02-19 MED ORDER — ONDANSETRON HCL 4 MG/2ML IJ SOLN
INTRAMUSCULAR | Status: AC
  Filled 2019-02-19: qty 2

## 2019-02-19 MED ORDER — BUPIVACAINE-EPINEPHRINE 0.25% -1:200000 IJ SOLN
INTRAMUSCULAR | Status: DC | PRN
  Administered 2019-02-19: 60 mL

## 2019-02-19 MED ORDER — LIDOCAINE 2% (20 MG/ML) 5 ML SYRINGE
INTRAMUSCULAR | Status: DC | PRN
  Administered 2019-02-19: 1.5 mg/kg/h via INTRAVENOUS

## 2019-02-19 MED ORDER — PHENYLEPHRINE 40 MCG/ML (10ML) SYRINGE FOR IV PUSH (FOR BLOOD PRESSURE SUPPORT)
PREFILLED_SYRINGE | INTRAVENOUS | Status: AC
  Filled 2019-02-19: qty 10

## 2019-02-19 MED ORDER — KETAMINE HCL 10 MG/ML IJ SOLN
INTRAMUSCULAR | Status: DC | PRN
  Administered 2019-02-19 (×2): 10 mg via INTRAVENOUS
  Administered 2019-02-19: 15 mg via INTRAVENOUS

## 2019-02-19 MED ORDER — MIDAZOLAM HCL 2 MG/2ML IJ SOLN
INTRAMUSCULAR | Status: AC
  Filled 2019-02-19: qty 2

## 2019-02-19 MED ORDER — CEFAZOLIN SODIUM-DEXTROSE 2-4 GM/100ML-% IV SOLN
2.0000 g | INTRAVENOUS | Status: AC
  Administered 2019-02-19: 2 g via INTRAVENOUS
  Filled 2019-02-19: qty 100

## 2019-02-19 MED ORDER — PROPOFOL 10 MG/ML IV BOLUS
INTRAVENOUS | Status: DC | PRN
  Administered 2019-02-19: 150 mg via INTRAVENOUS

## 2019-02-19 MED ORDER — LACTATED RINGERS IV SOLN
INTRAVENOUS | Status: DC
  Administered 2019-02-19: 08:00:00 via INTRAVENOUS
  Administered 2019-02-19: 50 mL/h via INTRAVENOUS
  Administered 2019-02-19: 15:00:00 via INTRAVENOUS
  Filled 2019-02-19: qty 1000

## 2019-02-19 MED ORDER — HYDROCODONE-ACETAMINOPHEN 5-325 MG PO TABS: 1.0000 | ORAL_TABLET | Freq: Four times a day (QID) | ORAL | 0 refills | Status: DC | PRN

## 2019-02-19 MED ORDER — CEFAZOLIN SODIUM-DEXTROSE 2-4 GM/100ML-% IV SOLN
INTRAVENOUS | Status: AC
  Filled 2019-02-19: qty 100

## 2019-02-19 MED ORDER — PHENYLEPHRINE 40 MCG/ML (10ML) SYRINGE FOR IV PUSH (FOR BLOOD PRESSURE SUPPORT)
PREFILLED_SYRINGE | INTRAVENOUS | Status: DC | PRN
  Administered 2019-02-19: 120 ug via INTRAVENOUS
  Administered 2019-02-19: 80 ug via INTRAVENOUS

## 2019-02-19 MED ORDER — MIDAZOLAM HCL 2 MG/2ML IJ SOLN
INTRAMUSCULAR | Status: DC | PRN
  Administered 2019-02-19: 1 mg via INTRAVENOUS

## 2019-02-19 MED ORDER — FENTANYL CITRATE (PF) 100 MCG/2ML IJ SOLN
INTRAMUSCULAR | Status: AC
  Filled 2019-02-19: qty 2

## 2019-02-19 MED ORDER — KETAMINE HCL 10 MG/ML IJ SOLN
INTRAMUSCULAR | Status: AC
  Filled 2019-02-19: qty 1

## 2019-02-19 MED ORDER — FENTANYL CITRATE (PF) 100 MCG/2ML IJ SOLN
INTRAMUSCULAR | Status: DC | PRN
  Administered 2019-02-19: 50 ug via INTRAVENOUS
  Administered 2019-02-19: 25 ug via INTRAVENOUS

## 2019-02-19 MED ORDER — EPHEDRINE SULFATE 50 MG/ML IJ SOLN
INTRAMUSCULAR | Status: DC | PRN
  Administered 2019-02-19 (×2): 5 mg via INTRAVENOUS

## 2019-02-19 MED ORDER — BUPIVACAINE LIPOSOME 1.3 % IJ SUSP
INTRAMUSCULAR | Status: DC | PRN
  Administered 2019-02-19: 20 mL

## 2019-02-19 MED ORDER — SUGAMMADEX SODIUM 200 MG/2ML IV SOLN
INTRAVENOUS | Status: DC | PRN
  Administered 2019-02-19: 200 mg via INTRAVENOUS

## 2019-02-19 MED ORDER — GABAPENTIN 300 MG PO CAPS
ORAL_CAPSULE | ORAL | Status: AC
  Filled 2019-02-19: qty 1

## 2019-02-19 MED ORDER — LIDOCAINE 2% (20 MG/ML) 5 ML SYRINGE
INTRAMUSCULAR | Status: AC
  Filled 2019-02-19: qty 5

## 2019-02-19 MED ORDER — ACETAMINOPHEN 500 MG PO TABS
ORAL_TABLET | ORAL | Status: AC
  Filled 2019-02-19: qty 2

## 2019-02-19 MED ORDER — ROCURONIUM BROMIDE 10 MG/ML (PF) SYRINGE
PREFILLED_SYRINGE | INTRAVENOUS | Status: DC | PRN
  Administered 2019-02-19: 50 mg via INTRAVENOUS
  Administered 2019-02-19: 10 mg via INTRAVENOUS
  Administered 2019-02-19: 5 mg via INTRAVENOUS

## 2019-02-19 MED ORDER — ONDANSETRON HCL 4 MG/2ML IJ SOLN
INTRAMUSCULAR | Status: DC | PRN
  Administered 2019-02-19: 4 mg via INTRAVENOUS

## 2019-02-19 MED ORDER — ROCURONIUM BROMIDE 10 MG/ML (PF) SYRINGE
PREFILLED_SYRINGE | INTRAVENOUS | Status: AC
  Filled 2019-02-19: qty 10

## 2019-02-19 MED ORDER — DEXAMETHASONE SODIUM PHOSPHATE 10 MG/ML IJ SOLN
INTRAMUSCULAR | Status: DC | PRN
  Administered 2019-02-19: 4 mg via INTRAVENOUS

## 2019-02-19 MED ORDER — ACETAMINOPHEN 500 MG PO TABS
1000.0000 mg | ORAL_TABLET | ORAL | Status: AC
  Administered 2019-02-19: 1000 mg via ORAL
  Filled 2019-02-19: qty 2

## 2019-02-19 MED ORDER — DEXAMETHASONE SODIUM PHOSPHATE 10 MG/ML IJ SOLN
INTRAMUSCULAR | Status: AC
  Filled 2019-02-19: qty 1

## 2019-02-19 SURGICAL SUPPLY — 54 items
APPLICATOR COTTON TIP 6 STRL (MISCELLANEOUS) IMPLANT
APPLICATOR COTTON TIP 6IN NSTR (MISCELLANEOUS) ×2 IMPLANT
APPLICATOR COTTON TIP 6IN STRL (MISCELLANEOUS) ×2
APPLIER CLIP 5 13 M/L LIGAMAX5 (MISCELLANEOUS) ×2
BINDER ABDOMINAL 12 ML 46-62 (SOFTGOODS) ×2 IMPLANT
CABLE HIGH FREQUENCY MONO STRZ (ELECTRODE) ×2 IMPLANT
CANISTER SUCT 3000ML PPV (MISCELLANEOUS) ×1 IMPLANT
CHLORAPREP W/TINT 26ML (MISCELLANEOUS) ×3 IMPLANT
CLIP APPLIE 5 13 M/L LIGAMAX5 (MISCELLANEOUS) IMPLANT
COVER WAND RF STERILE (DRAPES) ×2 IMPLANT
DECANTER SPIKE VIAL GLASS SM (MISCELLANEOUS) ×1 IMPLANT
DEVICE SECURE STRAP 25 ABSORB (INSTRUMENTS) ×1 IMPLANT
DEVICE TROCAR PUNCTURE CLOSURE (ENDOMECHANICALS) ×2 IMPLANT
DRAPE WARM FLUID 44X44 (DRAPES) ×2 IMPLANT
DRSG OPSITE POSTOP 4X10 (GAUZE/BANDAGES/DRESSINGS) ×1 IMPLANT
DRSG TEGADERM 2-3/8X2-3/4 SM (GAUZE/BANDAGES/DRESSINGS) ×6 IMPLANT
DRSG TEGADERM 4X4.75 (GAUZE/BANDAGES/DRESSINGS) ×1 IMPLANT
ELECT REM PT RETURN 9FT ADLT (ELECTROSURGICAL) ×2
ELECTRODE REM PT RTRN 9FT ADLT (ELECTROSURGICAL) ×1 IMPLANT
GLOVE ECLIPSE 8.0 STRL XLNG CF (GLOVE) ×2 IMPLANT
GLOVE INDICATOR 8.0 STRL GRN (GLOVE) ×2 IMPLANT
GOWN STRL REUS W/TWL XL LVL3 (GOWN DISPOSABLE) ×2 IMPLANT
IRRIG SUCT STRYKERFLOW 2 WTIP (MISCELLANEOUS)
IRRIGATION SUCT STRKRFLW 2 WTP (MISCELLANEOUS) IMPLANT
KIT TURNOVER CYSTO (KITS) ×2 IMPLANT
MESH VENTRALIGHT ST 8X10 (Mesh General) ×1 IMPLANT
NDL SPNL 22GX3.5 QUINCKE BK (NEEDLE) IMPLANT
NEEDLE HYPO 22GX1.5 SAFETY (NEEDLE) ×4 IMPLANT
NEEDLE SPNL 22GX3.5 QUINCKE BK (NEEDLE) ×2 IMPLANT
NS IRRIG 500ML POUR BTL (IV SOLUTION) ×3 IMPLANT
PACK BASIN DAY SURGERY FS (CUSTOM PROCEDURE TRAY) ×2 IMPLANT
PAD POSITIONING PINK XL (MISCELLANEOUS) ×2 IMPLANT
PENCIL BUTTON BLDE SNGL 10FT (ELECTRODE) ×1 IMPLANT
SCISSORS LAP 5X35 DISP (ENDOMECHANICALS) ×2 IMPLANT
SHEARS HARMONIC ACE PLUS 36CM (ENDOMECHANICALS) IMPLANT
SLEEVE ADV FIXATION 5X100MM (TROCAR) ×2 IMPLANT
SPONGE GAUZE 2X2 8PLY STRL LF (GAUZE/BANDAGES/DRESSINGS) ×6 IMPLANT
STRIP CLOSURE SKIN 1/2X4 (GAUZE/BANDAGES/DRESSINGS) ×3 IMPLANT
SUT MNCRL AB 4-0 PS2 18 (SUTURE) ×3 IMPLANT
SUT PDS AB 1 CT1 27 (SUTURE) ×6 IMPLANT
SUT PROLENE 1 CT 1 30 (SUTURE) ×12 IMPLANT
SUT VIC AB 2-0 SH 27 (SUTURE)
SUT VIC AB 2-0 SH 27XBRD (SUTURE) IMPLANT
SUT VIC AB 3-0 SH 18 (SUTURE) ×2 IMPLANT
SUT VIC AB 3-0 SH 27 (SUTURE)
SUT VIC AB 3-0 SH 27X BRD (SUTURE) IMPLANT
SUT VICRYL 4-0 PS2 18IN ABS (SUTURE) ×1 IMPLANT
TOWEL OR 17X26 10 PK STRL BLUE (TOWEL DISPOSABLE) ×2 IMPLANT
TRAY LAPAROSCOPIC (CUSTOM PROCEDURE TRAY) ×4 IMPLANT
TROCAR ADV FIXATION 11X100MM (TROCAR) IMPLANT
TROCAR ADV FIXATION 5X100MM (TROCAR) ×2 IMPLANT
TROCAR BLADELESS OPT 5 100 (ENDOMECHANICALS) ×2 IMPLANT
TUBING EVAC SMOKE HEATED PNEUM (TUBING) ×2 IMPLANT
WATER STERILE IRR 500ML POUR (IV SOLUTION) ×1 IMPLANT

## 2019-02-19 NOTE — Progress Notes (Signed)
02/19/2019  @1720 ,Moderate amount bleeding notied from the Right lower abdominal stab incision (patient in phase 2). Scant amount of bleeding was notied in phase 1 recovery and 2x2 applied. At  1720, Pressure applied to incision site, pressure dressing applied and  ice pack. No bleeding notied after pressure applied.  There was small bruising around the incision, redness notied around the incision and to the right  side of abdomen. At the time patient dressing to go home, small amount bleeding notied at the right mid abdominal  stab incision. Pressure dressing applied and abdominal binder in place. Dr. Barry Dienes notified, per Dr. Barry Dienes, instructed wife if bleeding notied again, apply pressure directly on the  incision with 2 fingers for 15 minutes, change dressing as needed and leave abdominal binder in place.

## 2019-02-19 NOTE — Interval H&P Note (Signed)
History and Physical Interval Note:  02/19/2019 10:58 AM  Kyle Flowers  has presented today for surgery, with the diagnosis of INCISIONAL NEW ABDOMINAL WALL HERNIA.  The various methods of treatment have been discussed with the patient and family. After consideration of risks, benefits and other options for treatment, the patient has consented to  Procedure(s): [place] (N/A) as a surgical intervention.  The patient's history has been reviewed, patient examined, no change in status, stable for surgery.  I have reviewed the patient's chart and labs.  Questions were answered to the patient's satisfaction.    I have re-reviewed the the patient's records, history, medications, and allergies.  I have re-examined the patient.  I again discussed intraoperative plans and goals of post-operative recovery.  The patient agrees to proceed.  Kyle Flowers  [birth year] [id number]  Patient Care Team: Leonides Sake, MD as PCP - General (Family Medicine) Michael Boston, MD as Consulting Physician (General Surgery) Raynelle Bring, MD as Consulting Physician (Urology)  Patient Active Problem List   Diagnosis Date Noted  . Prostate cancer ([place]) 03/25/2016    Past Medical History:  Diagnosis Date  . History of prostate cancer urologist-- dr Alinda Money   dx 11-29-2015---  s/p  radical prostatectomy 03-25-2016  . Hypertension   . Mixed hyperlipidemia   . Ventral hernia   . Wears dentures    upper  . Wears glasses   . Wears hearing aid in both ears     Past Surgical History:  Procedure Laterality Date  . APPENDECTOMY  teen  . LYMPHADENECTOMY Bilateral 03/25/2016   Procedure: XI Robotic assisted laparoscopic LYMPHADENECTOMY;  Surgeon: Raynelle Bring, MD;  Location: [facility];  Service: Urology;  Laterality: Bilateral;  . ROBOT ASSISTED LAPAROSCOPIC RADICAL PROSTATECTOMY N/A 03/25/2016   Procedure: XI ROBOTIC ASSISTED LAPAROSCOPIC RADICAL PROSTATECTOMY LEVEL 2;   Surgeon: Raynelle Bring, MD;  Location: [facility];  Service: Urology;  Laterality: N/A;    Social History   Socioeconomic History  . Marital status: Married    Spouse name: Not on file  . Number of children: Not on file  . Years of education: Not on file  . Highest education level: Not on file  Occupational History  . Not on file  Social Needs  . Financial resource strain: Not on file  . Food insecurity    Worry: Not on file    Inability: Not on file  . Transportation needs    Medical: Not on file    Non-medical: Not on file  Tobacco Use  . Smoking status: Never Smoker  . Smokeless tobacco: Never Used  Substance and Sexual Activity  . Alcohol use: No  . Drug use: No  . Sexual activity: Not on file  Lifestyle  . Physical activity    Days per week: Not on file    Minutes per session: Not on file  . Stress: Not on file  Relationships  . Social Herbalist on phone: Not on file    Gets together: Not on file    Attends religious service: Not on file    Active member of club or organization: Not on file    Attends meetings of clubs or organizations: Not on file    Relationship status: Not on file  . Intimate partner violence    Fear of current or ex partner: Not on file    Emotionally abused: Not on file    Physically abused: Not on  file    Forced sexual activity: Not on file  Other Topics Concern  . Not on file  Social History Narrative  . Not on file    History reviewed. No pertinent family history.  Medications Prior to Admission  Medication Sig Dispense Refill Last Dose  . aspirin EC 81 MG tablet Take 81 mg by mouth daily.   02/18/2019 at Unknown time  . ergocalciferol (VITAMIN D2) 1.25 MG (50000 UT) capsule Take 50,000 Units by mouth once a week.   Past Week at Unknown time  . lisinopril (PRINIVIL,ZESTRIL) 20 MG tablet Take 20 mg by mouth every evening.    02/18/2019 at Unknown time  . lovastatin (MEVACOR) 40 MG tablet Take 40 mg by mouth at bedtime.     02/18/2019 at Unknown time    Current Facility-Administered Medications  Medication Dose Route Frequency Provider Last Rate Last Dose  . bupivacaine liposome (EXPAREL) 1.3 % injection 266 mg  20 mL Infiltration Once Michael Boston, MD      . ceFAZolin (ANCEF) IVPB 2g/100 mL premix  2 g Intravenous On Call to OR Michael Boston, MD      . lactated ringers infusion   Intravenous Continuous Lyn Hollingshead, MD 50 mL/hr at 02/19/19 0820       No Known Allergies  BP (!) 108/93   Pulse 91   Temp 98.3 F (36.8 C) (Oral)   Resp 18   Ht 5\' 10"  (1.778 m)   Wt 96.4 kg   SpO2 100%   BMI 30.48 kg/m   Labs: Results for orders placed or performed during the hospital encounter of 02/19/19 (from the past 48 hour(s))  I-STAT 4, (NA,K, GLUC, HGB,HCT)     Status: Abnormal   Collection Time: 02/19/19  8:22 AM  Result Value Ref Range   Sodium 141 135 - 145 mmol/L   Potassium 4.1 3.5 - 5.1 mmol/L   Glucose, Bld 101 (H) 70 - 99 mg/dL   HCT 50.0 39.0 - 52.0 %   Hemoglobin 17.0 13.0 - 17.0 g/dL    Imaging / Studies: No results found.   [name], M.D., F.A.C.S. Gastrointestinal and Minimally Invasive Surgery [facility] [address] [place], [place] [address] [phone] Main / Paging  02/19/2019 10:58 AM    [name]

## 2019-02-19 NOTE — Anesthesia Preprocedure Evaluation (Signed)
Anesthesia Evaluation  Patient identified by MRN, date of birth, ID band Patient awake  General Assessment Comment:Hard of hearing.  Reviewed: Allergy & Precautions, NPO status , Patient's Chart, lab work & pertinent test results  Airway Mallampati: II  TM Distance: >3 FB Neck ROM: Full    Dental  (+) Edentulous Upper, Edentulous Lower, Dental Advisory Given   Pulmonary neg pulmonary ROS,    Pulmonary exam normal breath sounds clear to auscultation       Cardiovascular Exercise Tolerance: Good hypertension, Normal cardiovascular exam Rhythm:Regular Rate:Normal     Neuro/Psych negative neurological ROS  negative psych ROS   GI/Hepatic negative GI ROS, Neg liver ROS,   Endo/Other  negative endocrine ROS  Renal/GU negative Renal ROS     Musculoskeletal negative musculoskeletal ROS (+)   Abdominal   Peds  Hematology negative hematology ROS (+)   Anesthesia Other Findings   Reproductive/Obstetrics                             Anesthesia Physical  Anesthesia Plan  ASA: II  Anesthesia Plan: General   Post-op Pain Management:    Induction: Intravenous  PONV Risk Score and Plan: 4 or greater and Ondansetron, Dexamethasone and Treatment may vary due to age or medical condition  Airway Management Planned: Oral ETT  Additional Equipment: None  Intra-op Plan:   Post-operative Plan: Extubation in OR  Informed Consent: I have reviewed the patients History and Physical, chart, labs and discussed the procedure including the risks, benefits and alternatives for the proposed anesthesia with the patient or authorized representative who has indicated his/her understanding and acceptance.     Dental advisory given  Plan Discussed with: CRNA  Anesthesia Plan Comments:         Anesthesia Quick Evaluation

## 2019-02-19 NOTE — Discharge Instructions (Signed)
HERNIA REPAIR: POST OP INSTRUCTIONS ° °###################################################################### ° °EAT °Gradually transition to a high fiber diet with a fiber supplement over the next few weeks after discharge.  Start with a pureed / full liquid diet (see below) ° °WALK °Walk an hour a day.  Control your pain to do that.   ° °CONTROL PAIN °Control pain so that you can walk, sleep, tolerate sneezing/coughing, and go up/down stairs. ° °HAVE A BOWEL MOVEMENT DAILY °Keep your bowels regular to avoid problems.  OK to try a laxative to override constipation.  OK to use an antidairrheal to slow down diarrhea.  Call if not better after 2 tries ° °CALL IF YOU HAVE PROBLEMS/CONCERNS °Call if you are still struggling despite following these instructions. °Call if you have concerns not answered by these instructions ° °###################################################################### ° ° ° °1. DIET: Follow a light bland diet & liquids the first 24 hours after arrival home, such as soup, liquids, starches, etc.  Be sure to drink plenty of fluids.  Quickly advance to a usual solid diet within a few days.  Avoid fast food or heavy meals as your are more likely to get nauseated or have irregular bowels.  A low-fat, high-fiber diet for the rest of your life is ideal.  ° °2. Take your usually prescribed home medications unless otherwise directed. ° °3. PAIN CONTROL: °a. Pain is best controlled by a usual combination of three different methods TOGETHER: °i. Ice/Heat °ii. Over the counter pain medication °iii. Prescription pain medication °b. Most patients will experience some swelling and bruising around the hernia(s) such as the bellybutton, groins, or old incisions.  Ice packs or heating pads (30-60 minutes up to 6 times a day) will help. Use ice for the first few days to help decrease swelling and bruising, then switch to heat to help relax tight/sore spots and speed recovery.  Some people prefer to use ice  alone, heat alone, alternating between ice & heat.  Experiment to what works for you.  Swelling and bruising can take several weeks to resolve.   °c. It is helpful to take an over-the-counter pain medication regularly for the first few weeks.  Choose one of the following that works best for you: °i. Naproxen (Aleve, etc)  Two 220mg tabs twice a day °ii. Ibuprofen (Advil, etc) Three 200mg tabs four times a day (every meal & bedtime) °iii. Acetaminophen (Tylenol, etc) 325-650mg four times a day (every meal & bedtime) °d. A  prescription for pain medication should be given to you upon discharge.  Take your pain medication as prescribed.  °i. If you are having problems/concerns with the prescription medicine (does not control pain, nausea, vomiting, rash, itching, etc), please call us [phone] to see if we need to switch you to a different pain medicine that will work better for you and/or control your side effect better. °ii. If you need a refill on your pain medication, please contact your pharmacy.  They will contact our office to request authorization. Prescriptions will not be filled after 5 pm or on week-ends. ° °4. Avoid getting constipated.  Between the surgery and the pain medications, it is common to experience some constipation.  Increasing fluid intake and taking a fiber supplement (such as Metamucil, Citrucel, FiberCon, MiraLax, etc) 1-2 times a day regularly will usually help prevent this problem from occurring.  A mild laxative (prune juice, Milk of Magnesia, MiraLax, etc) should be taken according to package directions if there are no bowel movements after 48   hours.    5. Wash / shower every day.  You may shower over the dressings as they are waterproof.    6. Remove your waterproof bandages, skin tapes, and other bandages 5 days after surgery = 02/24/2019 WEDNESDAY. You may replace a dressing/Band-Aid to cover the incision for comfort if you wish. You may leave the incisions open to air.  You  may replace a dressing/Band-Aid to cover an incision for comfort if you wish.  Continue to shower over incision(s) after the dressing is off.  7. ACTIVITIES as tolerated:   a. You may resume regular (light) daily activities beginning the next day--such as daily self-care, walking, climbing stairs--gradually increasing activities as tolerated.  Control your pain so that you can walk an hour a day.  If you can walk 30 minutes without difficulty, it is safe to try more intense activity such as jogging, treadmill, bicycling, low-impact aerobics, swimming, etc. b. Save the most intensive and strenuous activity for last such as sit-ups, heavy lifting, contact sports, etc  Refrain from any heavy lifting or straining until you are off narcotics for pain control.   c. DO NOT PUSH THROUGH PAIN.  Let pain be your guide: If it hurts to do something, don't do it.  Pain is your body warning you to avoid that activity for another week until the pain goes down. d. You may drive when you are no longer taking prescription pain medication, you can comfortably wear a seatbelt, and you can safely maneuver your car and apply brakes. e. Dennis Bast may have sexual intercourse when it is comfortable.   8. FOLLOW UP in our office a. Please call [facility] at [phone] to set up an appointment to see your surgeon in the office for a follow-up appointment approximately 2-3 weeks after your surgery. b. Make sure that you call for this appointment the day you arrive home to insure a convenient appointment time.  9.  If you have disability of FMLA / Family leave forms, please bring the forms to the office for processing.  (do not give to your surgeon).  WHEN TO CALL us [phone]: 1. Poor pain control 2. Reactions / problems with new medications (rash/itching, nausea, etc)  3. Fever over 101.5 F (38.5 C) 4. Inability to urinate 5. Nausea and/or vomiting 6. Worsening swelling or bruising 7. Continued bleeding from  incision. 8. Increased pain, redness, or drainage from the incision   The clinic staff is available to answer your questions during regular business hours (8:30am-5pm).  Please dont hesitate to call and ask to speak to one of our nurses for clinical concerns.   If you have a medical emergency, go to the nearest emergency room or call 911.  A surgeon from [facility] is always on call at the hospitals in [facility], [place], [place], [place], [place]  [address] ?  [address] [address], [place], [place]   [address] MAIN: [phone] ? TOLL FREE: [phone] ? FAX: [phone] [email or web address]  Post Anesthesia Home Care Instructions  Activity: Get plenty of rest for the remainder of the day. A responsible individual must stay with you for 24 hours following the procedure.  For the next 24 hours, DO NOT: -Drive a car -Paediatric nurse -Drink alcoholic beverages -Take any medication unless instructed by your physician -Make any legal decisions or sign important papers.  Meals: Start with liquid foods such as gelatin or soup. Progress to regular foods as tolerated.  Avoid greasy, spicy, heavy foods. If nausea and/or vomiting occur, drink only clear liquids until the nausea and/or vomiting subsides. Call your physician if vomiting continues.  Special Instructions/Symptoms: Your throat may feel dry or sore from the anesthesia or the breathing tube placed in your throat during surgery. If this causes discomfort, gargle with warm salt water. The discomfort should disappear within 24 hours.  If you had a scopolamine patch placed behind your ear for the management of post- operative nausea and/or vomiting:  1. The medication in the patch is effective for 72 hours, after which it should be removed.  Wrap patch in a tissue and discard in the trash. Wash hands thoroughly with soap and water. 2. You may remove the patch earlier than  72 hours if you experience unpleasant side effects which may include dry mouth, dizziness or visual disturbances. 3. Avoid touching the patch. Wash your hands with soap and water after contact with the patch.    Information for Discharge Teaching: EXPAREL (bupivacaine liposome injectable suspension)   Your surgeon or anesthesiologist gave you EXPAREL(bupivacaine) to help control your pain after surgery.   EXPAREL is a local anesthetic that provides pain relief by numbing the tissue around the surgical site.  EXPAREL is designed to release pain medication over time and can control pain for up to 72 hours.  Depending on how you respond to EXPAREL, you may require less pain medication during your recovery.  Possible side effects:  Temporary loss of sensation or ability to move in the area where bupivacaine was injected.  Nausea, vomiting, constipation  Rarely, numbness and tingling in your mouth or lips, lightheadedness, or anxiety may occur.  Call your doctor right away if you think you may be experiencing any of these sensations, or if you have other questions regarding possible side effects.  Follow all other discharge instructions given to you by your surgeon or nurse. Eat a healthy diet and drink plenty of water or other fluids.  If you return to the hospital for any reason within 96 hours following the administration of EXPAREL, it is important for health care providers to know that you have received this anesthetic. A teal colored band has been placed on your arm with the date, time and amount of EXPAREL you have received in order to alert and inform your health care providers. Please leave this armband in place for the full 96 hours following administration, and then you may remove the band.

## 2019-02-19 NOTE — Anesthesia Postprocedure Evaluation (Signed)
Anesthesia Post Note  Patient: Kyle Flowers  Procedure(s) Performed: LAPAROSCOPIC VENTRAL WALL HERNIA REPAIR WITH MESH (N/A Abdomen)     Patient location during evaluation: PACU Anesthesia Type: General Level of consciousness: sedated and patient cooperative Pain management: pain level controlled Vital Signs Assessment: post-procedure vital signs reviewed and stable Respiratory status: spontaneous breathing Cardiovascular status: stable Anesthetic complications: no    Last Vitals:  Vitals:   02/19/19 0815 02/19/19 1414  BP: (!) 108/93 134/88  Pulse: 91   Resp: 18   Temp: 36.8 C 36.6 C  SpO2: 100%     Last Pain:  Vitals:   02/19/19 1414  TempSrc:   PainSc: [place]

## 2019-02-19 NOTE — Transfer of Care (Signed)
Immediate Anesthesia Transfer of Care Note  Patient: Kyle Flowers  Procedure(s) Performed: LAPAROSCOPIC VENTRAL WALL HERNIA REPAIR WITH MESH (N/A Abdomen)  Patient Location: PACU  Anesthesia Type:General  Level of Consciousness: awake  Airway & Oxygen Therapy: Patient Spontanous Breathing and Patient connected to nasal cannula oxygen  Post-op Assessment: Report given to RN and Post -op Vital signs reviewed and stable  Post vital signs: Reviewed and stable  Last Vitals:  Vitals Value Taken Time  BP    Temp    Pulse    Resp    SpO2      Last Pain:  Vitals:   02/19/19 0815  TempSrc: Oral  PainSc: 0-No pain      Patients Stated Pain Goal: 7 (91/98/02 2179)  Complications: No apparent anesthesia complications

## 2019-02-19 NOTE — Op Note (Signed)
02/19/2019  PATIENT:  Kyle Flowers  68 y.o. male  Patient Care Team: Hamrick, Lorin Mercy, MD as PCP - General (Family Medicine) Michael Boston, MD as Consulting Physician (General Surgery) Raynelle Bring, MD as Consulting Physician (Urology)  PRE-OPERATIVE DIAGNOSIS:  INCISIONAL NEW ABDOMINAL WALL HERNIA  POST-OPERATIVE DIAGNOSIS:  INCISIONAL NEW ABDOMINAL WALL HERNIA  PROCEDURE: LAPAROSCOPIC [place] WITH MESH  SURGEON:  [name], MD  ASSISTANT: Nurse   ANESTHESIA:     General  Nerve block provided with liposomal bupivacaine (Experel) mixed with 0.25% bupivacaine as a Bilateral TAP block x 39mL each side at the level of the transverse abdominis & preperitoneal spaces along the flank at the anterior axillary line, from subcostal ridge to iliac crest under laparoscopic guidance   EBL:  Total I/O In: 700 [I.V.:700] Out: -   Per anesthesia record  Delay start of Pharmacological VTE agent (>24hrs) due to surgical blood loss or risk of bleeding:  no  DRAINS: none   SPECIMEN:  No Specimen  DISPOSITION OF SPECIMEN:  N/A  COUNTS:  YES  PLAN OF CARE: Discharge to home after PACU  PATIENT DISPOSITION:  PACU - hemodynamically stable.  INDICATION: Pleasant patient has developed a ventral wall abdominal hernia.   Recommendation was made for surgical repair:  The anatomy & physiology of the abdominal wall was discussed. The pathophysiology of hernias was discussed. Natural history risks without surgery including progeressive enlargement, pain, incarceration & strangulation was discussed. Contributors to complications such as smoking, obesity, diabetes, prior surgery, etc were discussed.  I feel the risks of no intervention will lead to serious problems that outweigh the operative risks; therefore, I recommended surgery to reduce and repair the hernia. I explained laparoscopic techniques with possible need for an open approach. I noted the probable use  of mesh to patch and/or buttress the hernia repair  Risks such as bleeding, infection, abscess, need for further treatment, heart attack, death, and other risks were discussed. I noted a good likelihood this will help address the problem. Goals of post-operative recovery were discussed as well. Possibility that this will not correct all symptoms was explained. I stressed the importance of low-impact activity, aggressive pain control, avoiding constipation, & not pushing through pain to minimize risk of post-operative chronic pain or injury. Possibility of reherniation especially with smoking, obesity, diabetes, immunosuppression, and other health conditions was discussed. We will work to minimize complications.  An educational handout further explaining the pathology & treatment options was given as well. Questions were answered. The patient expresses understanding & wishes to proceed with surgery.   OR FINDINGS: 7 x 4 cm Swiss cheese region of hernias periumbilically with very stretched out hernia sac and redundant skin  Type of repair: Laparoscopic underlay repair   Placement of mesh: Intraperitoneal underlay repair  Name of mesh: Bard Ventralight dual sided (polypropylene / Seprafilm)  Size of mesh: 25x20cm  Orientation: Transverse  Mesh overlap:  5-7cm   DESCRIPTION:   Informed consent was confirmed. The patient underwent general anaesthesia without difficulty. The patient was positioned appropriately. VTE prevention in place. The patient's abdomen was clipped, prepped, & draped in a sterile fashion. Surgical timeout confirmed our plan.  The patient was positioned in reverse Trendelenburg. Abdominal entry was gained using optical entry technique in the left upper abdomen. Entry was clean. I induced carbon dioxide insufflation. Camera inspection revealed no injury. Extra ports were carefully placed under direct laparoscopic visualization.   I could see the hernia on the  parietal peritoneum  under the abdominal wall.   I did laparoscopic lysis of adhesions to expose the entire anterior abdominal wall.  I primarily used focused sharp dissection.   freed off the falciform ligament and central peritoneum to expose the retrorectus fascia   use that to reduce the large hernia sac down as well and help mobilize peritoneum off the lower half of the abdomen.  I made sure hemostasis was good.  I mapped out the region using a needle passer.   To ensure that I would have at least 5 cm radial coverage outside of the hernia defect, I chose a 25x20cm dual sided mesh.  I placed #1 Prolene stitches around its edge about every 5 cm = 12 total.  I rolled the mesh & placed into the peritoneal cavity through the hernia defect.  I unrolled the mesh and positioned it appropriately.  I secured the mesh to cover up the hernia defect using a laparoscopic suture passer to pass the tails of the Prolene through the abdominal wall & tagged them with clamps for good transfascial suturing.  I started out in four corners to make sure I had the mesh centered under the hernia defect appropriately, and then proceeded to work in quadrants.    We evacuated CO2 & desufflated the abdomen.  I tied the fascial stitches down. I closed the fascial defect that I placed the mesh through using #1 PDS interrupted transverse stitches primarily.  Did bring some stitches into the center part of the mesh to help tack it as well.  I reinsufflated the abdomen. The mesh provided at least circumferential coverage around the entire region of hernia defects.  I secured the mesh centrally with an additional trans fascial stitch in & out the mesh using #1 PDS under laparoscopic visualization X2.   I tacked the edges & central part of the mesh to the peritoneum/posterior rectus fascia with SecureStrap absorbable tacks.   I did reinspection. Hemostasis was good. Mesh laid well. I completed a broad field block of local anesthesia at fascial stitch sites &  fascial closure areas.    Capnoperitoneum was evacuated. Ports were removed.  Patient had very redundant enlarged thinned out skin.  I did a transverse bio concave excision of the redundant skin until was much more flat.  And to be about 15 cm total.  Closed transversely with 0 Vicryl deep dermal interrupted sutures as well as Monocryl suture.  The skin was closed with Monocryl at the port sites and Steri-Strips on the fascial stitch puncture sites.  Patient is being extubated to go to the recovery room.  I discussed operative findings, updated the patient's status, discussed probable steps to recovery, and gave postoperative recommendations to the patient's spouse.  Recommendations were made.  Questions were answered.  She expressed understanding & appreciation.  [name], M.D., F.A.C.S. Gastrointestinal and Minimally Invasive Surgery [facility] [address], [place], [place] [address] [phone] Main / Paging  02/19/2019 1:59 PM

## 2019-02-19 NOTE — Anesthesia Procedure Notes (Signed)
Procedure Name: Intubation Date/Time: 02/19/2019 11:16 AM Performed by: Niel Hummer, CRNA Pre-anesthesia Checklist: Patient being monitored, Suction available, Emergency Drugs available and Patient identified Patient Re-evaluated:Patient Re-evaluated prior to induction Oxygen Delivery Method: Circle system utilized Preoxygenation: Pre-oxygenation with 100% oxygen Induction Type: IV induction and Rapid sequence Laryngoscope Size: Mac and 4 Grade View: Grade II Tube type: Oral Tube size: 7.5 mm Number of attempts: 1 Airway Equipment and Method: Stylet Placement Confirmation: ETT inserted through vocal cords under direct vision,  positive ETCO2 and breath sounds checked- equal and bilateral Secured at: 23 cm Tube secured with: Tape Dental Injury: Teeth and Oropharynx as per pre-operative assessment

## 2019-02-22 ENCOUNTER — Encounter (HOSPITAL_BASED_OUTPATIENT_CLINIC_OR_DEPARTMENT_OTHER): Payer: Self-pay | Admitting: Surgery

## 2019-02-22 NOTE — Progress Notes (Signed)
Pt states he has been nauseated for three days. He called MD office and they have called in antiemetic. Pt denies bleeding, abdominal distention or fever. Instructed pt to go for appointment if nausea is no better after a day or two. Pt agrees.

## 2019-03-19 DIAGNOSIS — C61 Malignant neoplasm of prostate: Secondary | ICD-10-CM | POA: Diagnosis not present

## 2019-03-26 DIAGNOSIS — C61 Malignant neoplasm of prostate: Secondary | ICD-10-CM | POA: Diagnosis not present

## 2019-09-28 DIAGNOSIS — C61 Malignant neoplasm of prostate: Secondary | ICD-10-CM | POA: Diagnosis not present

## 2019-10-06 DIAGNOSIS — C61 Malignant neoplasm of prostate: Secondary | ICD-10-CM | POA: Diagnosis not present

## 2019-11-15 DIAGNOSIS — R7309 Other abnormal glucose: Secondary | ICD-10-CM | POA: Diagnosis not present

## 2019-11-15 DIAGNOSIS — Z1211 Encounter for screening for malignant neoplasm of colon: Secondary | ICD-10-CM | POA: Diagnosis not present

## 2019-11-15 DIAGNOSIS — I10 Essential (primary) hypertension: Secondary | ICD-10-CM | POA: Diagnosis not present

## 2019-11-15 DIAGNOSIS — E782 Mixed hyperlipidemia: Secondary | ICD-10-CM | POA: Diagnosis not present

## 2020-04-26 DIAGNOSIS — C61 Malignant neoplasm of prostate: Secondary | ICD-10-CM | POA: Diagnosis not present

## 2020-05-05 ENCOUNTER — Other Ambulatory Visit: Payer: Self-pay

## 2020-05-05 ENCOUNTER — Emergency Department (HOSPITAL_COMMUNITY): Payer: Medicare HMO

## 2020-05-05 ENCOUNTER — Inpatient Hospital Stay (HOSPITAL_COMMUNITY)
Admission: EM | Admit: 2020-05-05 | Discharge: 2020-05-12 | DRG: 177 | Disposition: A | Discharge status: Home / Self Care | Payer: Medicare HMO | Attending: Internal Medicine | Admitting: Internal Medicine

## 2020-05-05 ENCOUNTER — Encounter (HOSPITAL_COMMUNITY): Payer: Self-pay | Admitting: Emergency Medicine

## 2020-05-05 DIAGNOSIS — Z9079 Acquired absence of other genital organ(s): Secondary | ICD-10-CM | POA: Diagnosis not present

## 2020-05-05 DIAGNOSIS — I10 Essential (primary) hypertension: Secondary | ICD-10-CM | POA: Diagnosis present

## 2020-05-05 DIAGNOSIS — J9601 Acute respiratory failure with hypoxia: Secondary | ICD-10-CM | POA: Diagnosis present

## 2020-05-05 DIAGNOSIS — U071 COVID-19: Principal | ICD-10-CM

## 2020-05-05 DIAGNOSIS — Y92239 Unspecified place in hospital as the place of occurrence of the external cause: Secondary | ICD-10-CM | POA: Diagnosis not present

## 2020-05-05 DIAGNOSIS — Z8546 Personal history of malignant neoplasm of prostate: Secondary | ICD-10-CM

## 2020-05-05 DIAGNOSIS — Z974 Presence of external hearing-aid: Secondary | ICD-10-CM

## 2020-05-05 DIAGNOSIS — J1282 Pneumonia due to coronavirus disease 2019: Secondary | ICD-10-CM | POA: Diagnosis present

## 2020-05-05 DIAGNOSIS — Z7982 Long term (current) use of aspirin: Secondary | ICD-10-CM

## 2020-05-05 DIAGNOSIS — E1165 Type 2 diabetes mellitus with hyperglycemia: Secondary | ICD-10-CM | POA: Diagnosis not present

## 2020-05-05 DIAGNOSIS — T380X5A Adverse effect of glucocorticoids and synthetic analogues, initial encounter: Secondary | ICD-10-CM | POA: Diagnosis not present

## 2020-05-05 DIAGNOSIS — E782 Mixed hyperlipidemia: Secondary | ICD-10-CM | POA: Diagnosis present

## 2020-05-05 DIAGNOSIS — Z79899 Other long term (current) drug therapy: Secondary | ICD-10-CM

## 2020-05-05 DIAGNOSIS — R0902 Hypoxemia: Secondary | ICD-10-CM

## 2020-05-05 DIAGNOSIS — Z7952 Long term (current) use of systemic steroids: Secondary | ICD-10-CM | POA: Diagnosis not present

## 2020-05-05 LAB — CBC WITH DIFFERENTIAL/PLATELET
Abs Immature Granulocytes: 0.28 10*3/uL — ABNORMAL HIGH (ref 0.00–0.07)
Basophils Absolute: 0 10*3/uL (ref 0.0–0.1)
Basophils Relative: 0 %
Eosinophils Absolute: 0 10*3/uL (ref 0.0–0.5)
Eosinophils Relative: 0 %
HCT: 48.7 % (ref 39.0–52.0)
Hemoglobin: 16 g/dL (ref 13.0–17.0)
Immature Granulocytes: 2 %
Lymphocytes Relative: 6 %
Lymphs Abs: 0.8 10*3/uL (ref 0.7–4.0)
MCH: 31.3 pg (ref 26.0–34.0)
MCHC: 32.9 g/dL (ref 30.0–36.0)
MCV: 95.3 fL (ref 80.0–100.0)
Monocytes Absolute: 0.8 10*3/uL (ref 0.1–1.0)
Monocytes Relative: 6 %
Neutro Abs: 10.7 10*3/uL — ABNORMAL HIGH (ref 1.7–7.7)
Neutrophils Relative %: 86 %
Platelets: 214 10*3/uL (ref 150–400)
RBC: 5.11 MIL/uL (ref 4.22–5.81)
RDW: 13.8 % (ref 11.5–15.5)
WBC: 12.5 10*3/uL — ABNORMAL HIGH (ref 4.0–10.5)
nRBC: 0 % (ref 0.0–0.2)

## 2020-05-05 LAB — URINALYSIS, ROUTINE W REFLEX MICROSCOPIC
Bacteria, UA: NONE SEEN
Bilirubin Urine: NEGATIVE
Glucose, UA: NEGATIVE mg/dL
Ketones, ur: NEGATIVE mg/dL
Leukocytes,Ua: NEGATIVE
Nitrite: NEGATIVE
Protein, ur: 100 mg/dL — AB
Specific Gravity, Urine: 1.018 (ref 1.005–1.030)
pH: 6 (ref 5.0–8.0)

## 2020-05-05 LAB — COMPREHENSIVE METABOLIC PANEL
ALT: 35 U/L (ref 0–44)
AST: 47 U/L — ABNORMAL HIGH (ref 15–41)
Albumin: 3.1 g/dL — ABNORMAL LOW (ref 3.5–5.0)
Alkaline Phosphatase: 42 U/L (ref 38–126)
Anion gap: 12 (ref 5–15)
BUN: 22 mg/dL (ref 8–23)
CO2: 26 mmol/L (ref 22–32)
Calcium: 8.2 mg/dL — ABNORMAL LOW (ref 8.9–10.3)
Chloride: 96 mmol/L — ABNORMAL LOW (ref 98–111)
Creatinine, Ser: 1.04 mg/dL (ref 0.61–1.24)
GFR, Estimated: >60 mL/min (ref >60)
Glucose, Bld: 133 mg/dL — ABNORMAL HIGH (ref 70–99)
Potassium: 4.4 mmol/L (ref 3.5–5.1)
Sodium: 134 mmol/L — ABNORMAL LOW (ref 135–145)
Total Bilirubin: 1 mg/dL (ref 0.3–1.2)
Total Protein: 7 g/dL (ref 6.5–8.1)

## 2020-05-05 LAB — RESPIRATORY PANEL BY RT PCR (FLU A&B, COVID)
Influenza A by PCR: NEGATIVE
Influenza B by PCR: NEGATIVE
SARS Coronavirus 2 by RT PCR: POSITIVE — AB

## 2020-05-05 LAB — PROCALCITONIN: Procalcitonin: 0.23 ng/mL

## 2020-05-05 LAB — LACTIC ACID, PLASMA
Lactic Acid, Venous: 1.1 mmol/L (ref 0.5–1.9)
Lactic Acid, Venous: 1.1 mmol/L (ref 0.5–1.9)

## 2020-05-05 LAB — FIBRINOGEN: Fibrinogen: 798 mg/dL — ABNORMAL HIGH (ref 210–475)

## 2020-05-05 LAB — D-DIMER, QUANTITATIVE: D-Dimer, Quant: 0.77 ug/mL-FEU — ABNORMAL HIGH (ref 0.00–0.50)

## 2020-05-05 LAB — TRIGLYCERIDES: Triglycerides: 78 mg/dL (ref <150)

## 2020-05-05 LAB — LACTATE DEHYDROGENASE: LDH: 331 U/L — ABNORMAL HIGH (ref 98–192)

## 2020-05-05 LAB — PROTIME-INR
INR: 1.2 (ref 0.8–1.2)
Prothrombin Time: 14.7 seconds (ref 11.4–15.2)

## 2020-05-05 LAB — APTT: aPTT: 28 seconds (ref 24–36)

## 2020-05-05 LAB — FERRITIN: Ferritin: 1186 ng/mL — ABNORMAL HIGH (ref 24–336)

## 2020-05-05 LAB — C-REACTIVE PROTEIN: CRP: 16.8 mg/dL — ABNORMAL HIGH (ref <1.0)

## 2020-05-05 MED ORDER — ONDANSETRON HCL 4 MG/2ML IJ SOLN
4.0000 mg | Freq: Four times a day (QID) | INTRAMUSCULAR | Status: DC | PRN
  Administered 2020-05-06 – 2020-05-10 (×4): 4 mg via INTRAVENOUS
  Filled 2020-05-05 (×4): qty 2

## 2020-05-05 MED ORDER — SODIUM CHLORIDE 0.9 % IV SOLN
1000.0000 mL | INTRAVENOUS | Status: DC
  Administered 2020-05-05 (×2): 1000 mL via INTRAVENOUS

## 2020-05-05 MED ORDER — ACETAMINOPHEN 325 MG PO TABS
650.0000 mg | ORAL_TABLET | Freq: Once | ORAL | Status: AC | PRN
  Administered 2020-05-05: 650 mg via ORAL
  Filled 2020-05-05: qty 2

## 2020-05-05 MED ORDER — SODIUM CHLORIDE 0.9 % IV SOLN
2.0000 g | INTRAVENOUS | Status: DC
  Administered 2020-05-05 – 2020-05-09 (×5): 2 g via INTRAVENOUS
  Filled 2020-05-05: qty 20
  Filled 2020-05-05 (×3): qty 2
  Filled 2020-05-05 (×2): qty 20

## 2020-05-05 MED ORDER — ASPIRIN EC 81 MG PO TBEC
81.0000 mg | DELAYED_RELEASE_TABLET | Freq: Every day | ORAL | Status: DC
  Administered 2020-05-05 – 2020-05-11 (×7): 81 mg via ORAL
  Filled 2020-05-05 (×7): qty 1

## 2020-05-05 MED ORDER — DEXAMETHASONE SODIUM PHOSPHATE 10 MG/ML IJ SOLN
10.0000 mg | Freq: Once | INTRAMUSCULAR | Status: AC
  Administered 2020-05-05: 10 mg via INTRAVENOUS
  Filled 2020-05-05: qty 1

## 2020-05-05 MED ORDER — SODIUM CHLORIDE 0.9 % IV SOLN
200.0000 mg | Freq: Once | INTRAVENOUS | Status: AC
  Administered 2020-05-05: 200 mg via INTRAVENOUS
  Filled 2020-05-05: qty 200

## 2020-05-05 MED ORDER — LACTATED RINGERS IV SOLN: INTRAVENOUS | Status: DC

## 2020-05-05 MED ORDER — ENOXAPARIN SODIUM 40 MG/0.4ML ~~LOC~~ SOLN
40.0000 mg | SUBCUTANEOUS | Status: DC
  Administered 2020-05-05 – 2020-05-11 (×7): 40 mg via SUBCUTANEOUS
  Filled 2020-05-05 (×7): qty 0.4

## 2020-05-05 MED ORDER — ALBUTEROL SULFATE HFA 108 (90 BASE) MCG/ACT IN AERS: 2.0000 | INHALATION_SPRAY | RESPIRATORY_TRACT | Status: DC | PRN

## 2020-05-05 MED ORDER — LISINOPRIL 20 MG PO TABS
20.0000 mg | ORAL_TABLET | Freq: Every evening | ORAL | Status: DC
  Administered 2020-05-05 – 2020-05-11 (×7): 20 mg via ORAL
  Filled 2020-05-05 (×7): qty 1

## 2020-05-05 MED ORDER — SODIUM CHLORIDE 0.9 % IV SOLN
500.0000 mg | INTRAVENOUS | Status: DC
  Administered 2020-05-05 – 2020-05-09 (×5): 500 mg via INTRAVENOUS
  Filled 2020-05-05 (×7): qty 500

## 2020-05-05 MED ORDER — ONDANSETRON HCL 4 MG PO TABS
4.0000 mg | ORAL_TABLET | Freq: Four times a day (QID) | ORAL | Status: DC | PRN
  Administered 2020-05-09 – 2020-05-10 (×2): 4 mg via ORAL
  Filled 2020-05-05 (×2): qty 1

## 2020-05-05 MED ORDER — PRAVASTATIN SODIUM 20 MG PO TABS
40.0000 mg | ORAL_TABLET | Freq: Every day | ORAL | Status: DC
  Administered 2020-05-06 – 2020-05-11 (×6): 40 mg via ORAL
  Filled 2020-05-05 (×6): qty 2

## 2020-05-05 MED ORDER — ACETAMINOPHEN 325 MG PO TABS
650.0000 mg | ORAL_TABLET | Freq: Four times a day (QID) | ORAL | Status: DC | PRN
  Administered 2020-05-09 – 2020-05-10 (×2): 650 mg via ORAL
  Filled 2020-05-05 (×2): qty 2

## 2020-05-05 MED ORDER — DEXAMETHASONE SODIUM PHOSPHATE 10 MG/ML IJ SOLN
6.0000 mg | Freq: Every day | INTRAMUSCULAR | Status: DC
  Filled 2020-05-05: qty 1

## 2020-05-05 MED ORDER — ZINC SULFATE 220 (50 ZN) MG PO CAPS
220.0000 mg | ORAL_CAPSULE | Freq: Every day | ORAL | Status: DC
  Administered 2020-05-05 – 2020-05-12 (×8): 220 mg via ORAL
  Filled 2020-05-05 (×8): qty 1

## 2020-05-05 MED ORDER — GUAIFENESIN-DM 100-10 MG/5ML PO SYRP
10.0000 mL | ORAL_SOLUTION | ORAL | Status: DC | PRN
  Administered 2020-05-07: 10 mL via ORAL
  Filled 2020-05-05: qty 10

## 2020-05-05 MED ORDER — ASCORBIC ACID 500 MG PO TABS
500.0000 mg | ORAL_TABLET | Freq: Every day | ORAL | Status: DC
  Administered 2020-05-05 – 2020-05-12 (×8): 500 mg via ORAL
  Filled 2020-05-05 (×8): qty 1

## 2020-05-05 MED ORDER — SODIUM CHLORIDE 0.9 % IV SOLN
100.0000 mg | Freq: Every day | INTRAVENOUS | Status: AC
  Administered 2020-05-06 – 2020-05-09 (×4): 100 mg via INTRAVENOUS
  Filled 2020-05-05 (×4): qty 20

## 2020-05-05 MED ORDER — LACTATED RINGERS IV BOLUS (SEPSIS)
1000.0000 mL | Freq: Once | INTRAVENOUS | Status: AC
  Administered 2020-05-05: 1000 mL via INTRAVENOUS

## 2020-05-05 MED ORDER — HYDROCOD POLST-CPM POLST ER 10-8 MG/5ML PO SUER
5.0000 mL | Freq: Two times a day (BID) | ORAL | Status: DC | PRN
  Administered 2020-05-05 – 2020-05-11 (×9): 5 mL via ORAL
  Filled 2020-05-05 (×9): qty 5

## 2020-05-05 NOTE — ED Triage Notes (Signed)
Per pt, states he has been having SOB and low O2 for over a week-states no cold symptoms, no covid exposure-states PCP sent him to ED for eval

## 2020-05-05 NOTE — ED Provider Notes (Signed)
Medical screening examination/treatment/procedure(s) were conducted as a shared visit with non-physician practitioner(s) and myself.  I personally evaluated the patient during the encounter.  EKG Interpretation  Date/Time:  Friday May 05 2020 13:12:10 EDT Ventricular Rate:  112 PR Interval:    QRS Duration: 96 QT Interval:  313 QTC Calculation: 428 R Axis:   17 Text Interpretation: Sinus tachycardia Low voltage, precordial leads Abnormal R-wave progression, early transition ST elevation, consider inferior injury Confirmed by Lacretia Leigh ([id number]) on 05/05/2020 1:48:59 PM  69 year old male who presents with cough and shortness of breath.  X-ray shows multifocal pneumonia.  Concern for also Covid.  Patient started on IV antibiotics and will require hospitalization   Lacretia Leigh, MD 05/05/20 1321

## 2020-05-05 NOTE — ED Notes (Signed)
Report called for pt transfer to the floor  

## 2020-05-05 NOTE — H&P (Signed)
History and Physical    Kyle Flowers:[id number] DOB: [birth year] DOA: 05/05/2020  PCP: Hinton Lovely, MD  Patient coming from: Home  Chief Complaint: Dyspnea  HPI: Kyle Flowers is a 69 y.o. male with medical history significant of HTN, HLD. Presenting with 1 week of shortness of breath. He states about a week ago he noticed some cough and congestion in his chest. He took some nyquil, which helped a little but didn't completely resolve his issues. The symptoms progressed and he decided to see his PCP. He was given antibiotics and steroids for bronchitis and sinus congestion. This did not resolve all his symptoms. He became increasingly concerned and went for COVID testing today. That POC testing came back negative. So, it was recommended he come to the ED for a workup. However, in the ED he was found to be COVID positive. He denies any other aggravating or alleviating factors. He denies any other treatments.   ED Course: Found to be COVID positive. CXR w/ airspace opacities c/w COVID. Started on remdes, decadron. TRH called for admission.    Review of Systems:  Review of systems is otherwise negative for all not mentioned in HPI.   PMHx Past Medical History:  Diagnosis Date  . Cancer ([place])   . History of prostate cancer urologist-- dr Alinda Money   dx 11-29-2015---  s/p  radical prostatectomy 03-25-2016  . Hypertension   . Mixed hyperlipidemia   . Ventral hernia   . Wears dentures    upper  . Wears glasses   . Wears hearing aid in both ears     PSHx Past Surgical History:  Procedure Laterality Date  . APPENDECTOMY  teen  . LYMPHADENECTOMY Bilateral 03/25/2016   Procedure: XI Robotic assisted laparoscopic LYMPHADENECTOMY;  Surgeon: Raynelle Bring, MD;  Location: [facility];  Service: Urology;  Laterality: Bilateral;  . ROBOT ASSISTED LAPAROSCOPIC RADICAL PROSTATECTOMY N/A 03/25/2016   Procedure: XI ROBOTIC ASSISTED LAPAROSCOPIC RADICAL PROSTATECTOMY LEVEL 2;  Surgeon: Raynelle Bring, MD;  Location: [facility];  Service: Urology;  Laterality: N/A;  . VENTRAL HERNIA REPAIR N/A 02/19/2019   Procedure: LAPAROSCOPIC VENTRAL WALL HERNIA REPAIR WITH MESH;  Surgeon: Michael Boston, MD;  Location: [facility];  Service: General;  Laterality: N/A;    SocHx  reports that he has never smoked. He has never used smokeless tobacco. He reports that he does not drink alcohol and does not use drugs.  No Known Allergies  FamHx No family history on file.  Prior to Admission medications   Medication Sig Start Date End Date Taking? Authorizing Provider  acetaminophen (TYLENOL) 500 MG tablet Take 1,000 mg by mouth every 6 (six) hours as needed for mild pain.   Yes [provider]  albuterol (VENTOLIN HFA) 108 (90 Base) MCG/ACT inhaler Inhale 2 puffs into the lungs every 4 (four) hours as needed for shortness of breath. 05/02/20  Yes [provider]  aspirin EC 81 MG tablet Take 81 mg by mouth at bedtime.    Yes [provider]  cefdinir (OMNICEF) 300 MG capsule Take 300 mg by mouth in the morning and at bedtime. 05/02/20  Yes [provider]  ergocalciferol (VITAMIN D2) 1.25 MG (50000 UT) capsule Take 50,000 Units by mouth once a week.   Yes [provider]  lisinopril (PRINIVIL,ZESTRIL) 20 MG tablet Take 20 mg by mouth every evening.    Yes [provider]  lovastatin (MEVACOR) 40 MG tablet Take 40 mg by mouth  at bedtime.    Yes [provider]  predniSONE (DELTASONE) 20 MG tablet Take 20 mg by mouth 2 (two) times daily. 05/02/20  Yes [provider]  Probiotic Product (PROBIOTIC DAILY PO) Take 1 capsule by mouth daily.   Yes [provider]    Physical Exam: Vitals:   05/05/20 1457 05/05/20 1623 05/05/20 1700 05/05/20 1748  BP:  136/83 (!) 148/87 (!) 150/91  Pulse:  (!) 104 (!) 105 (!) 107  Resp:  (!) 29 (!) 28 19  Temp: 99 F (37.2 C)   99.5 F (37.5 C)  TempSrc: Oral   Oral  SpO2:  91%  91% 91%  Weight:    94.6 kg  Height:    5\' 10"  (1.778 m)    General: 69 y.o. male resting in bed in NAD Eyes: PERRL, normal sclera ENMT: Nares patent w/o discharge, orophaynx clear, dentition normal, ears w/o discharge/lesions/ulcers Neck: Supple, trachea midline Cardiovascular: RRR, +S1, S2, no m/g/r, equal pulses throughout Respiratory: b/l expiratory wheeze, slightly increased WOB on 5L [place] GI: BS+, NDNT, no masses noted, no organomegaly noted MSK: No e/c/c Skin: No rashes, bruises, ulcerations noted Neuro: A&O x 3, no focal deficits Psyc: Appropriate interaction and affect, calm/cooperative  Labs on Admission: I have personally reviewed following labs and imaging studies  CBC: Recent Labs  Lab 05/05/20 1225  WBC 12.5*  NEUTROABS 10.7*  HGB 16.0  HCT 48.7  MCV 95.3  PLT 829   Basic Metabolic Panel: Recent Labs  Lab 05/05/20 1225  NA 134*  K 4.4  CL 96*  CO2 26  GLUCOSE 133*  BUN 22  CREATININE 1.04  CALCIUM 8.2*   GFR: Estimated Creatinine Clearance: 75.2 mL/min (by C-G formula based on SCr of 1.04 mg/dL). Liver Function Tests: Recent Labs  Lab 05/05/20 1225  AST 47*  ALT 35  ALKPHOS 42  BILITOT 1.0  PROT 7.0  ALBUMIN 3.1*   No results for input(s): LIPASE, AMYLASE in the last 168 hours. No results for input(s): AMMONIA in the last 168 hours. Coagulation Profile: Recent Labs  Lab 05/05/20 1247  INR 1.2   Cardiac Enzymes: No results for input(s): CKTOTAL, CKMB, CKMBINDEX, TROPONINI in the last 168 hours. BNP (last 3 results) No results for input(s): PROBNP in the last 8760 hours. HbA1C: No results for input(s): HGBA1C in the last 72 hours. CBG: No results for input(s): GLUCAP in the last 168 hours. Lipid Profile: Recent Labs    05/05/20 1225  TRIG 78   Thyroid Function Tests: No results for input(s): TSH, T4TOTAL, FREET4, T3FREE, THYROIDAB in the last 72 hours. Anemia Panel: Recent Labs    05/05/20 1225  FERRITIN 1,186*   Urine  analysis:    Component Value Date/Time   COLORURINE YELLOW 05/05/2020 1624   APPEARANCEUR CLEAR 05/05/2020 1624   LABSPEC 1.018 05/05/2020 1624   PHURINE 6.0 05/05/2020 1624   GLUCOSEU NEGATIVE 05/05/2020 1624   HGBUR MODERATE (A) 05/05/2020 1624   BILIRUBINUR NEGATIVE 05/05/2020 [place] 05/05/2020 1624   PROTEINUR 100 (A) 05/05/2020 1624   NITRITE NEGATIVE 05/05/2020 1624   LEUKOCYTESUR NEGATIVE 05/05/2020 1624    Radiological Exams on Admission: DG Chest 2 View  Result Date: 05/05/2020 CLINICAL DATA:  Cough and shortness of breath. Decreased oxygen saturation EXAM: CHEST - 2 VIEW COMPARISON:  May 03, 2020 FINDINGS: Shallow degree of inspiration. Increase in airspace consolidation in the right mid and lower lung regions. Atelectatic change left base. Heart is upper normal  in size with pulmonary vascularity normal. No adenopathy. There is multifocal arthropathy in the lumbar spine. IMPRESSION: Shallow degree of inspiration. Airspace opacity consistent with multifocal pneumonia on the right in the mid and lower lung regions with increase in consolidation compared to 2 days prior. Left base atelectasis. Stable cardiac silhouette. Check of COVID-19 status may be advisable in this circumstance. Electronically Signed   By: Lowella Grip III M.D.   On: 05/05/2020 12:35   Assessment/Plan COVID 19 PNA     - admit to inpatient, med-surg     - redmes, decadron, IS, Flutter, albuterol, anti-tussives     - Wean O2 as able  HTN     - resume home lisinopril  HLD     - resume home statin  DVT prophylaxis: lovenos  Code Status: FULL  Family Communication: None at bedside  Consults called: None  Admission status: Inpatient  Status is: Inpatient  Remains inpatient appropriate because:Inpatient level of care appropriate due to severity of illness   Dispo: The patient is from: Home              Anticipated d/c is to: Home              Anticipated d/c date is: > 3  days              Patient currently is not medically stable to d/c.  Jonnie Finner DO [facility]  If 7PM-7AM, please contact night-coverage [email or web address]  05/05/2020, 6:51 PM

## 2020-05-05 NOTE — ED Provider Notes (Signed)
K[facility] DEPT Provider Note   CSN: [id number] Arrival date & time: 05/05/20  1119     History Chief Complaint  Patient presents with  . Shortness of Breath    Kyle Flowers is a 69 y.o. male.  HPI      Kyle Flowers is a 69 y.o. male, with a history of previous prostate cancer, hyperlipidemia, presenting to the ED with persistent cough beginning September 28.  Patient was seen by his PCP, Dr. Unk Lightning, with [facility] on October 5, diagnosed with bronchitis and sinus congestion.  He was prescribed 5 days of prednisone 20 mg twice daily and cefdinir 300 mg 2 times daily for 10 days.  POC Covid antigen test was negative today.  He was told to come to the ED for continued symptoms No vaccination for COVID-19. Denies known fever, shortness of breath, chest pain, abdominal pain, lower extremity edema, N/V/D, urinary symptoms, or any other complaints.    Past Medical History:  Diagnosis Date  . Cancer ([place])   . History of prostate cancer urologist-- dr Alinda Money   dx 11-29-2015---  s/p  radical prostatectomy 03-25-2016  . Hypertension   . Mixed hyperlipidemia   . Ventral hernia   . Wears dentures    upper  . Wears glasses   . Wears hearing aid in both ears     Patient Active Problem List   Diagnosis Date Noted  . Incisional hernia s/p lap repair w mesh 02/19/2019 02/19/2019  . Mixed dyslipidemia 07/21/2017  . Essential hypertension 07/21/2017  . Prostate cancer ([place]) 03/25/2016    Past Surgical History:  Procedure Laterality Date  . APPENDECTOMY  teen  . LYMPHADENECTOMY Bilateral 03/25/2016   Procedure: XI Robotic assisted laparoscopic LYMPHADENECTOMY;  Surgeon: Raynelle Bring, MD;  Location: [facility];  Service: Urology;  Laterality: Bilateral;  . ROBOT ASSISTED LAPAROSCOPIC RADICAL PROSTATECTOMY N/A 03/25/2016   Procedure: XI ROBOTIC ASSISTED LAPAROSCOPIC RADICAL PROSTATECTOMY LEVEL 2;  Surgeon: Raynelle Bring, MD;  Location: [facility];   Service: Urology;  Laterality: N/A;  . VENTRAL HERNIA REPAIR N/A 02/19/2019   Procedure: LAPAROSCOPIC VENTRAL WALL HERNIA REPAIR WITH MESH;  Surgeon: Michael Boston, MD;  Location: [facility];  Service: General;  Laterality: N/A;       No family history on file.  Social History   Tobacco Use  . Smoking status: Never Smoker  . Smokeless tobacco: Never Used  Vaping Use  . Vaping Use: Never used  Substance Use Topics  . Alcohol use: No  . Drug use: No    Home Medications Prior to Admission medications   Medication Sig Start Date End Date Taking? Authorizing Provider  aspirin EC 81 MG tablet Take 81 mg by mouth daily.    [provider]  ergocalciferol (VITAMIN D2) 1.25 MG (50000 UT) capsule Take 50,000 Units by mouth once a week.    [provider]  HYDROcodone-acetaminophen (NORCO) 5-325 MG tablet Take 1-2 tablets by mouth every 6 (six) hours as needed for moderate pain or severe pain. 02/19/19   Michael Boston, MD  lisinopril (PRINIVIL,ZESTRIL) 20 MG tablet Take 20 mg by mouth every evening.     [provider]  lovastatin (MEVACOR) 40 MG tablet Take 40 mg by mouth at bedtime.     [provider]    Allergies    Patient has no known allergies.  Review of Systems   Review of Systems  Constitutional: Negative for chills and fever.  Respiratory:  Positive for cough. Negative for shortness of breath.   Cardiovascular: Negative for chest pain and leg swelling.  Gastrointestinal: Negative for abdominal pain, diarrhea, nausea and vomiting.  Genitourinary: Negative for dysuria, frequency and hematuria.  Neurological: Negative for dizziness, syncope, weakness and headaches.  All other systems reviewed and are negative.   Physical Exam Updated Vital Signs BP (!) 148/101 (BP Location: Left Arm)   Pulse (!) 127   Temp (!) 103.1 F (39.5 C) (Oral)   Resp 18   SpO2 90% Comment: with 3L of oxygen.   Physical Exam Vitals and  nursing note reviewed.  Constitutional:      General: He is not in acute distress.    Appearance: He is well-developed. He is not diaphoretic.  HENT:     Head: Normocephalic and atraumatic.     Mouth/Throat:     Mouth: Mucous membranes are moist.     Pharynx: Oropharynx is clear.  Eyes:     Conjunctiva/sclera: Conjunctivae normal.  Cardiovascular:     Rate and Rhythm: Regular rhythm. Tachycardia present.     Pulses: Normal pulses.          Radial pulses are 2+ on the right side and 2+ on the left side.       Posterior tibial pulses are 2+ on the right side and 2+ on the left side.     Heart sounds: Normal heart sounds.     Comments: Tactile temperature in the extremities appropriate and equal bilaterally. Pulmonary:     Breath sounds: Rhonchi present.     Comments: Initial SPO2 down to 90% on 3 L supplemental O2.  93% on 4 L supplemental O2. Abdominal:     Palpations: Abdomen is soft.     Tenderness: There is no abdominal tenderness. There is no guarding.  Musculoskeletal:     Cervical back: Neck supple.     Right lower leg: No edema.     Left lower leg: No edema.  Lymphadenopathy:     Cervical: No cervical adenopathy.  Skin:    General: Skin is warm and dry.  Neurological:     Mental Status: He is alert.  Psychiatric:        Mood and Affect: Mood and affect normal.        Speech: Speech normal.        Behavior: Behavior normal.     ED Results / Procedures / Treatments   Labs (all labs ordered are listed, but only abnormal results are displayed) Labs Reviewed  RESPIRATORY PANEL BY RT PCR (FLU A&B, COVID) - Abnormal; Notable for the following components:      Result Value   SARS Coronavirus 2 by RT PCR POSITIVE (*)    All other components within normal limits  CBC WITH DIFFERENTIAL/PLATELET - Abnormal; Notable for the following components:   WBC 12.5 (*)    Neutro Abs 10.7 (*)    Abs Immature Granulocytes 0.28 (*)    All other components within normal limits    COMPREHENSIVE METABOLIC PANEL - Abnormal; Notable for the following components:   Sodium 134 (*)    Chloride 96 (*)    Glucose, Bld 133 (*)    Calcium 8.2 (*)    Albumin 3.1 (*)    AST 47 (*)    All other components within normal limits  LACTATE DEHYDROGENASE - Abnormal; Notable for the following components:   LDH 331 (*)    All other components within normal limits  FERRITIN -  Abnormal; Notable for the following components:   Ferritin 1,186 (*)    All other components within normal limits  C-REACTIVE PROTEIN - Abnormal; Notable for the following components:   CRP 16.8 (*)    All other components within normal limits  D-DIMER, QUANTITATIVE (NOT AT [facility]) - Abnormal; Notable for the following components:   D-Dimer, Quant 0.77 (*)    All other components within normal limits  FIBRINOGEN - Abnormal; Notable for the following components:   Fibrinogen 798 (*)    All other components within normal limits  CULTURE, BLOOD (ROUTINE X 2)  CULTURE, BLOOD (ROUTINE X 2)  URINE CULTURE  LACTIC ACID, PLASMA  PROCALCITONIN  TRIGLYCERIDES  PROTIME-INR  APTT  LACTIC ACID, PLASMA  URINALYSIS, ROUTINE W REFLEX MICROSCOPIC    EKG EKG Interpretation  Date/Time:  Friday May 05 2020 13:12:10 EDT Ventricular Rate:  112 PR Interval:    QRS Duration: 96 QT Interval:  313 QTC Calculation: 428 R Axis:   17 Text Interpretation: Sinus tachycardia Low voltage, precordial leads Abnormal R-wave progression, early transition ST elevation, consider inferior injury Confirmed by Lacretia Leigh ([id number]) on 05/05/2020 1:21:36 PM   Radiology DG Chest 2 View  Result Date: 05/05/2020 CLINICAL DATA:  Cough and shortness of breath. Decreased oxygen saturation EXAM: CHEST - 2 VIEW COMPARISON:  May 03, 2020 FINDINGS: Shallow degree of inspiration. Increase in airspace consolidation in the right mid and lower lung regions. Atelectatic change left base. Heart is upper normal in size with pulmonary  vascularity normal. No adenopathy. There is multifocal arthropathy in the lumbar spine. IMPRESSION: Shallow degree of inspiration. Airspace opacity consistent with multifocal pneumonia on the right in the mid and lower lung regions with increase in consolidation compared to 2 days prior. Left base atelectasis. Stable cardiac silhouette. Check of COVID-19 status may be advisable in this circumstance. Electronically Signed   By: Lowella Grip III M.D.   On: 05/05/2020 12:35    Procedures .Critical Care Performed by: Lorayne Bender, PA-C Authorized by: Lorayne Bender, PA-C   Critical care provider statement:    Critical care time (minutes):  35   Critical care time was exclusive of:  Separately billable procedures and treating other patients   Critical care was necessary to treat or prevent imminent or life-threatening deterioration of the following conditions:  Respiratory failure   Critical care was time spent personally by me on the following activities:  Ordering and performing treatments and interventions, ordering and review of laboratory studies, ordering and review of radiographic studies, pulse oximetry, re-evaluation of patient's condition, review of old charts, obtaining history from patient or surrogate, examination of patient, evaluation of patient's response to treatment, discussions with consultants and development of treatment plan with patient or surrogate   I assumed direction of critical care for this patient from another provider in my specialty: no     (including critical care time)  Medications Ordered in ED Medications  0.9 %  sodium chloride infusion (1,000 mLs Intravenous New Bag/Given 05/05/20 1224)  lactated ringers infusion (has no administration in time range)  lactated ringers bolus 1,000 mL (has no administration in time range)  cefTRIAXone (ROCEPHIN) 2 g in sodium chloride 0.9 % 100 mL IVPB (has no administration in time range)  azithromycin (ZITHROMAX) 500 mg in  sodium chloride 0.9 % 250 mL IVPB (has no administration in time range)  dexamethasone (DECADRON) injection 10 mg (has no administration in time range)  remdesivir 200 mg in sodium chloride 0.9%  250 mL IVPB (has no administration in time range)    Followed by  remdesivir 100 mg in sodium chloride 0.9 % 100 mL IVPB (has no administration in time range)  acetaminophen (TYLENOL) tablet 650 mg (650 mg Oral Given 05/05/20 1231)    ED Course  I have reviewed the triage vital signs and the nursing notes.  Pertinent labs & imaging results that were available during my care of the patient were reviewed by me and considered in my medical decision making (see chart for details).  Clinical Course as of May 06 1451  Fri May 05, 2020  1324 Erroneous entry.  Cuff was adjusted and more accurate reading was noted.  BP: 98/75 [SJ]  1407 Patient's oxygen had come off.  Nasal cannula was placed back on the patient.  SPO2 rose to 93%.  SpO2(!): 80 % [SJ]  0960 Spoke with Dr. Marylyn Ishihara, hospitalist.  Agrees to admit the patient.  Requests we add orders for a steroid and remdesivir. These orders were placed.   [SJ]    Clinical Course User Index [SJ] Kiaya Haliburton, Helane Gunther, PA-C   MDM Rules/Calculators/A&P                          Patient presents with about 10 days of cough. Patient arrives febrile and hypoxic with tachycardia. There is suspicion for COVID-19 infection, however, with labs pending and leukocytosis present, this could still certainly be Covid, but we felt it was more important to initiate code sepsis and start antibiotic therapy as soon as possible in the event that the Covid test returns negative. Evidence of multifocal pneumonia on chest x-ray.  Covid here did return positive. Patient reevaluated multiple times through his ED course. Admitted for further management.   Findings and plan of care discussed with Lacretia Leigh, MD. Dr. Zenia Resides personally evaluated and examined this patient.  Vitals:    05/05/20 1126 05/05/20 1240 05/05/20 1314 05/05/20 1400  BP: (!) 148/101 128/77 98/75 (!) 141/73  Pulse: (!) 127 (!) 116 (!) 113 (!) 105  Resp: 18 (!) 32 (!) 26 (!) 30  Temp: (!) 103.1 F (39.5 C)  99.6 F (37.6 C)   TempSrc: Oral  Oral   SpO2: 90% 91% 92% (!) 80%     Final Clinical Impression(s) / ED Diagnoses Final diagnoses:  COVID-19  Hypoxia    Rx / DC Orders ED Discharge Orders    None       Layla Maw 05/05/20 1500    Lacretia Leigh, MD 05/08/20 1359

## 2020-05-05 NOTE — Progress Notes (Signed)
Flutter valve sent to 5w

## 2020-05-06 LAB — CBC WITH DIFFERENTIAL/PLATELET
Abs Immature Granulocytes: 0.22 10*3/uL — ABNORMAL HIGH (ref 0.00–0.07)
Basophils Absolute: 0 10*3/uL (ref 0.0–0.1)
Basophils Relative: 0 %
Eosinophils Absolute: 0 10*3/uL (ref 0.0–0.5)
Eosinophils Relative: 0 %
HCT: 45.2 % (ref 39.0–52.0)
Hemoglobin: 14.3 g/dL (ref 13.0–17.0)
Immature Granulocytes: 2 %
Lymphocytes Relative: 5 %
Lymphs Abs: 0.6 10*3/uL — ABNORMAL LOW (ref 0.7–4.0)
MCH: 30.8 pg (ref 26.0–34.0)
MCHC: 31.6 g/dL (ref 30.0–36.0)
MCV: 97.4 fL (ref 80.0–100.0)
Monocytes Absolute: 0.5 10*3/uL (ref 0.1–1.0)
Monocytes Relative: 4 %
Neutro Abs: 12.1 10*3/uL — ABNORMAL HIGH (ref 1.7–7.7)
Neutrophils Relative %: 89 %
Platelets: 175 10*3/uL (ref 150–400)
RBC: 4.64 MIL/uL (ref 4.22–5.81)
RDW: 13.8 % (ref 11.5–15.5)
WBC: 13.4 10*3/uL — ABNORMAL HIGH (ref 4.0–10.5)
nRBC: 0 % (ref 0.0–0.2)

## 2020-05-06 LAB — COMPREHENSIVE METABOLIC PANEL
ALT: 30 U/L (ref 0–44)
AST: 41 U/L (ref 15–41)
Albumin: 2.5 g/dL — ABNORMAL LOW (ref 3.5–5.0)
Alkaline Phosphatase: 36 U/L — ABNORMAL LOW (ref 38–126)
Anion gap: 11 (ref 5–15)
BUN: 17 mg/dL (ref 8–23)
CO2: 24 mmol/L (ref 22–32)
Calcium: 7.6 mg/dL — ABNORMAL LOW (ref 8.9–10.3)
Chloride: 99 mmol/L (ref 98–111)
Creatinine, Ser: 0.63 mg/dL (ref 0.61–1.24)
GFR, Estimated: >60 mL/min (ref >60)
Glucose, Bld: 136 mg/dL — ABNORMAL HIGH (ref 70–99)
Potassium: 4.7 mmol/L (ref 3.5–5.1)
Sodium: 134 mmol/L — ABNORMAL LOW (ref 135–145)
Total Bilirubin: 0.7 mg/dL (ref 0.3–1.2)
Total Protein: 5.9 g/dL — ABNORMAL LOW (ref 6.5–8.1)

## 2020-05-06 LAB — FERRITIN: Ferritin: 1125 ng/mL — ABNORMAL HIGH (ref 24–336)

## 2020-05-06 LAB — D-DIMER, QUANTITATIVE: D-Dimer, Quant: 0.64 ug/mL-FEU — ABNORMAL HIGH (ref 0.00–0.50)

## 2020-05-06 LAB — C-REACTIVE PROTEIN: CRP: 21.9 mg/dL — ABNORMAL HIGH (ref <1.0)

## 2020-05-06 MED ORDER — FUROSEMIDE 10 MG/ML IJ SOLN
40.0000 mg | Freq: Once | INTRAMUSCULAR | Status: AC
  Administered 2020-05-06: 40 mg via INTRAVENOUS
  Filled 2020-05-06: qty 4

## 2020-05-06 MED ORDER — METHYLPREDNISOLONE SODIUM SUCC 40 MG IJ SOLR
40.0000 mg | Freq: Two times a day (BID) | INTRAMUSCULAR | Status: DC
  Administered 2020-05-06 – 2020-05-07 (×3): 40 mg via INTRAVENOUS
  Filled 2020-05-06 (×3): qty 1

## 2020-05-06 NOTE — Progress Notes (Addendum)
   05/06/20 1318  Vitals  Temp 98.8 F (37.1 C)  BP (!) 144/87  MAP (mmHg) 104  BP Location Left Arm  BP Method Automatic  Patient Position (if appropriate) Lying  Pulse Rate (!) 116  Pulse Rate Source Monitor  Resp (!) 25    Pt. Was re assessed, not on any distress. Lying in bed while watching TV. Escalated, Charge nurse informed,  MD was notified of the Yellow MEWS score. MEWS V/S guideline activated. Pt. Remains stable in the unit. Pt. Monitored closely.

## 2020-05-06 NOTE — Progress Notes (Signed)
PROGRESS NOTE  Kyle Flowers  DOB: 1950-10-23  PCP: Hinton Lovely, MD DGL:[id number]  DOA: 05/05/2020  LOS: 1 day   Chief Complaint  Patient presents with  . Shortness of Breath   Brief narrative: Kyle Flowers is a 69 y.o. male with PMH of HTN, HLD, prostate cancer status post radical prostatectomy. Patient presented to the ED on 05/05/2020 with 1 week history of progressively worsening shortness of breath, cough, congestion. 10/5, he was seen by PCP. He was prescribed 5 days of prednisone and 10 days of cefdinir for bronchitis and sinus congestion.  He went for a follow-up on 10/8 with persistent symptoms.  Covid antigen test was negative.  He was sent to ED for persistent symptoms. Not been vaccinated for COVID.  In the ED, patient had a temperature of 103.1, tachycardic to 127, tachypneic to 32.  Respiratory distress improved after he was started on 5 L of oxygen by nasal cannula. COVID-19 PCR positive. Labs with CRP elevated to 16.8, WBC 12.5, procalcitonin 0.23 Chest x-ray showed airspace opacity consistent with multifocal pneumonia on the right in the mid and lower lung regions with increase in consolidation compared to chest x-ray from 2 days prior. Patient was admitted to hospital service for further evaluation management.  Subjective: Patient was seen and examined this morning.  Pleasant elderly Caucasian male. Lying on bed.  He looks tired.  He had some respiratory wheezing. Also significantly hard of hearing. Chart reviewed Intermittent spikes of low-grade temperature. On 4 L oxygen by nasal cannula.  Tachycardia improving Lab this morning with CRP further elevated to 22  Assessment/Plan: COVID pneumonia Acute respiratory failure with hypoxia  -Presented with worsening shortness of breath -COVID test: PCR positive -Chest imaging: Multifocal pneumonia on the right -Treatment: 5-day course of IV remdesivir to complete on 10/12, IV Solu-Medrol 40 mg twice  daily -Oxygen - SpO2: 90 % O2 Flow Rate (L/min): 4 L/min -Patient was wheezing on my examination today.  We will give 1 dose of Lasix IV 40 mg. -Supportive care: Vitamin C, Zinc, PRN inhalers, Tylenol, Antitussives (benzonatate/ Mucinex/Tussionex). -Encouraged incentive spirometry, prone position, out of bed and early mobilization as much as possible -Continue airborne/contact isolation precautions for duration of 3 weeks from the day of diagnosis. -WBC and inflammatory markers trend as below.  CRP up today.  Continue to monitor.  Lab Results  Component Value Date   SARSCOV2NAA POSITIVE (A) 05/05/2020   [place] NEGATIVE 02/16/2019    Recent Labs  Lab 05/05/20 1225 05/05/20 1247 05/05/20 1445 05/06/20 0417  WBC 12.5*  --   --  13.4*  LATICACIDVEN 1.1  --  1.1  --   PROCALCITON 0.23  --   --   --   DDIMER  --  0.77*  --  0.64*  FERRITIN 1,186*  --   --  1,125*  LDH 331*  --   --   --   CRP 16.8*  --   --  21.9*  ALT 35  --   --  30   The treatment plan and use of medications and known side effects were discussed with patient/family. Some of the medications used are based on case reports/anecdotal data.  All other medications being used in the management of COVID-19 based on limited study data.  Complete risks and long-term side effects are unknown, however in the best clinical judgment they seem to be of some benefit. Patient wanted to proceed with treatment options provided.  Possible secondary bacterial  pneumonia -Procalcitonin elevated.  Empirically started on IV Rocephin and IV azithromycin which I will continue at this time.  Essential hypertension -Blood pressure stable on lisinopril -Stop IV fluid  Hyperlipidemia -Continue statin  Mobility: Encourage ambulation Code Status:   Code Status: Full Code  Nutritional status: Body mass index is 29.92 kg/m.     Diet Order            Diet regular Room service appropriate? Yes; Fluid consistency: Thin  Diet effective  now                 DVT prophylaxis: enoxaparin (LOVENOX) injection 40 mg Start: 05/05/20 2200   Antimicrobials:  IV Rocephin, IV azithromycin Fluid: IV fluids stopped Consultants: None Family Communication:  Called and updated patient's daughter and wife this morning.  Status is: Inpatient  Remains inpatient appropriate because: Covid pneumonia on IV treatment.  Dispo: The patient is from: Home              Anticipated d/c is to: Home hopefully              Anticipated d/c date is: > 3 days              Patient currently is not medically stable to d/c.   Infusions:  . sodium chloride 1,000 mL (05/05/20 2126)  . azithromycin Stopped (05/05/20 1622)  . cefTRIAXone (ROCEPHIN)  IV Stopped (05/05/20 1622)  . remdesivir 100 mg in NS 100 mL      Scheduled Meds: . vitamin C  500 mg Oral Daily  . aspirin EC  81 mg Oral QHS  . enoxaparin (LOVENOX) injection  40 mg Subcutaneous Q24H  . lisinopril  20 mg Oral QPM  . methylPREDNISolone (SOLU-MEDROL) injection  40 mg Intravenous Q12H  . pravastatin  40 mg Oral q1800  . zinc sulfate  220 mg Oral Daily    Antimicrobials: Anti-infectives (From admission, onward)   Start     Dose/Rate Route Frequency Ordered Stop   05/06/20 1000  remdesivir 100 mg in sodium chloride 0.9 % 100 mL IVPB       "Followed by" Linked Group Details   100 mg 200 mL/hr over 30 Minutes Intravenous Daily 05/05/20 1447 05/10/20 0959   05/05/20 1600  remdesivir 200 mg in sodium chloride 0.9% 250 mL IVPB       "Followed by" Linked Group Details   200 mg 580 mL/hr over 30 Minutes Intravenous Once 05/05/20 1447 05/05/20 1651   05/05/20 1300  cefTRIAXone (ROCEPHIN) 2 g in sodium chloride 0.9 % 100 mL IVPB        2 g 200 mL/hr over 30 Minutes Intravenous Every 24 hours 05/05/20 1255     05/05/20 1300  azithromycin (ZITHROMAX) 500 mg in sodium chloride 0.9 % 250 mL IVPB        500 mg 250 mL/hr over 60 Minutes Intravenous Every 24 hours 05/05/20 1255         PRN meds: acetaminophen, albuterol, chlorpheniramine-HYDROcodone, guaiFENesin-dextromethorphan, ondansetron **OR** ondansetron (ZOFRAN) IV   Objective: Vitals:   05/06/20 0152 05/06/20 0528  BP: 121/68 136/77  Pulse: 99 97  Resp: 20 (!) 24  Temp: 99.8 F (37.7 C) 98.7 F (37.1 C)  SpO2: 92% 90%    Intake/Output Summary (Last 24 hours) at 05/06/2020 0816 Last data filed at 05/06/2020 0600 Gross per 24 hour  Intake 3099.3 ml  Output 200 ml  Net 2899.3 ml   Filed Weights   05/05/20 1748  Weight: 94.6 kg   Weight change:  Body mass index is 29.92 kg/m.   Physical Exam: General exam: Appears calm and comfortable.  Looks tired Skin: No rashes, lesions or ulcers. HEENT: Atraumatic, normocephalic, supple neck, no obvious bleeding Lungs: Mild scattered wheezing bilaterally CVS: Regular rate and rhythm, no murmur GI/Abd soft, nontender, nondistended, bowel sound present CNS: Alert, awake, oriented x3, hard of hearing Psychiatry: Mood appropriate Extremities: No pedal edema, no calf tenderness  Data Review: I have personally reviewed the laboratory data and studies available.  Recent Labs  Lab 05/05/20 1225 05/06/20 0417  WBC 12.5* 13.4*  NEUTROABS 10.7* 12.1*  HGB 16.0 14.3  HCT 48.7 45.2  MCV 95.3 97.4  PLT 214 175   Recent Labs  Lab 05/05/20 1225 05/06/20 0417  NA 134* 134*  K 4.4 4.7  CL 96* 99  CO2 26 24  GLUCOSE 133* 136*  BUN 22 17  CREATININE 1.04 0.63  CALCIUM 8.2* 7.6*    F/u labs ordered  Signed, Terrilee Croak, MD [facility] 05/06/2020

## 2020-05-06 NOTE — Progress Notes (Signed)
Pt placed on HFNC at 8 L. O2 sats have risen from low 80s to 87%. Will continue to monitor.

## 2020-05-07 LAB — CBC WITH DIFFERENTIAL/PLATELET
Abs Immature Granulocytes: 0.33 10*3/uL — ABNORMAL HIGH (ref 0.00–0.07)
Basophils Absolute: 0 10*3/uL (ref 0.0–0.1)
Basophils Relative: 0 %
Eosinophils Absolute: 0 10*3/uL (ref 0.0–0.5)
Eosinophils Relative: 0 %
HCT: 45.7 % (ref 39.0–52.0)
Hemoglobin: 14.6 g/dL (ref 13.0–17.0)
Immature Granulocytes: 2 %
Lymphocytes Relative: 4 %
Lymphs Abs: 0.6 10*3/uL — ABNORMAL LOW (ref 0.7–4.0)
MCH: 31.2 pg (ref 26.0–34.0)
MCHC: 31.9 g/dL (ref 30.0–36.0)
MCV: 97.6 fL (ref 80.0–100.0)
Monocytes Absolute: 0.5 10*3/uL (ref 0.1–1.0)
Monocytes Relative: 3 %
Neutro Abs: 15.1 10*3/uL — ABNORMAL HIGH (ref 1.7–7.7)
Neutrophils Relative %: 91 %
Platelets: 276 10*3/uL (ref 150–400)
RBC: 4.68 MIL/uL (ref 4.22–5.81)
RDW: 14 % (ref 11.5–15.5)
WBC: 16.5 10*3/uL — ABNORMAL HIGH (ref 4.0–10.5)
nRBC: 0 % (ref 0.0–0.2)

## 2020-05-07 LAB — COMPREHENSIVE METABOLIC PANEL
ALT: 37 U/L (ref 0–44)
AST: 42 U/L — ABNORMAL HIGH (ref 15–41)
Albumin: 2.5 g/dL — ABNORMAL LOW (ref 3.5–5.0)
Alkaline Phosphatase: 40 U/L (ref 38–126)
Anion gap: 9 (ref 5–15)
BUN: 23 mg/dL (ref 8–23)
CO2: 30 mmol/L (ref 22–32)
Calcium: 7.9 mg/dL — ABNORMAL LOW (ref 8.9–10.3)
Chloride: 100 mmol/L (ref 98–111)
Creatinine, Ser: 0.81 mg/dL (ref 0.61–1.24)
GFR, Estimated: >60 mL/min (ref >60)
Glucose, Bld: 175 mg/dL — ABNORMAL HIGH (ref 70–99)
Potassium: 4.9 mmol/L (ref 3.5–5.1)
Sodium: 139 mmol/L (ref 135–145)
Total Bilirubin: 0.6 mg/dL (ref 0.3–1.2)
Total Protein: 6.2 g/dL — ABNORMAL LOW (ref 6.5–8.1)

## 2020-05-07 LAB — GLUCOSE, CAPILLARY
Glucose-Capillary: 220 mg/dL — ABNORMAL HIGH (ref 70–99)
Glucose-Capillary: 255 mg/dL — ABNORMAL HIGH (ref 70–99)

## 2020-05-07 LAB — URINE CULTURE: Culture: NO GROWTH

## 2020-05-07 LAB — D-DIMER, QUANTITATIVE: D-Dimer, Quant: 0.41 ug/mL-FEU (ref 0.00–0.50)

## 2020-05-07 LAB — FERRITIN: Ferritin: 1266 ng/mL — ABNORMAL HIGH (ref 24–336)

## 2020-05-07 LAB — C-REACTIVE PROTEIN: CRP: 16.6 mg/dL — ABNORMAL HIGH (ref <1.0)

## 2020-05-07 MED ORDER — FUROSEMIDE 10 MG/ML IJ SOLN
40.0000 mg | Freq: Every day | INTRAMUSCULAR | Status: DC
  Administered 2020-05-07 – 2020-05-12 (×6): 40 mg via INTRAVENOUS
  Filled 2020-05-07 (×6): qty 4

## 2020-05-07 MED ORDER — INSULIN ASPART 100 UNIT/ML ~~LOC~~ SOLN
0.0000 [IU] | Freq: Three times a day (TID) | SUBCUTANEOUS | Status: DC
  Administered 2020-05-07: 3 [IU] via SUBCUTANEOUS
  Administered 2020-05-08: 2 [IU] via SUBCUTANEOUS
  Administered 2020-05-08: 5 [IU] via SUBCUTANEOUS
  Administered 2020-05-08: 9 [IU] via SUBCUTANEOUS
  Administered 2020-05-09: 3 [IU] via SUBCUTANEOUS
  Administered 2020-05-09: 2 [IU] via SUBCUTANEOUS
  Administered 2020-05-09 – 2020-05-10 (×2): 7 [IU] via SUBCUTANEOUS
  Administered 2020-05-10: 2 [IU] via SUBCUTANEOUS
  Administered 2020-05-10: 5 [IU] via SUBCUTANEOUS
  Administered 2020-05-11: 7 [IU] via SUBCUTANEOUS
  Administered 2020-05-11: 3 [IU] via SUBCUTANEOUS
  Administered 2020-05-11: 2 [IU] via SUBCUTANEOUS
  Administered 2020-05-12: 5 [IU] via SUBCUTANEOUS
  Administered 2020-05-12: 2 [IU] via SUBCUTANEOUS

## 2020-05-07 MED ORDER — METHYLPREDNISOLONE SODIUM SUCC 125 MG IJ SOLR
60.0000 mg | Freq: Three times a day (TID) | INTRAMUSCULAR | Status: DC
  Administered 2020-05-07 – 2020-05-11 (×11): 60 mg via INTRAVENOUS
  Filled 2020-05-07 (×11): qty 2

## 2020-05-07 MED ORDER — INSULIN ASPART 100 UNIT/ML ~~LOC~~ SOLN
0.0000 [IU] | Freq: Every day | SUBCUTANEOUS | Status: DC
  Administered 2020-05-07 – 2020-05-08 (×2): 3 [IU] via SUBCUTANEOUS
  Administered 2020-05-09: 4 [IU] via SUBCUTANEOUS
  Administered 2020-05-10 – 2020-05-11 (×2): 2 [IU] via SUBCUTANEOUS

## 2020-05-07 NOTE — Progress Notes (Signed)
   05/06/20 1922  Assess: MEWS Score  Temp 98.9 F (37.2 C)  BP (!) 148/80  Pulse Rate (!) 108 (notified RN)  Resp (!) 28 (notified RN)  SpO2 (!) 88 % (notified RN)  O2 Device HFNC  O2 Flow Rate (L/min) 6 L/min  Assess: MEWS Score  MEWS Temp 0  MEWS Systolic 0  MEWS Pulse 1  MEWS RR 2  MEWS LOC 0  MEWS Score 3  MEWS Score Color Yellow  Assess: if the MEWS score is Yellow or Red  Were vital signs taken at a resting state? Yes  Focused Assessment Change from prior assessment (see assessment flowsheet)  Early Detection of Sepsis Score *See Row Information* Medium  MEWS guidelines implemented *See Row Information* No, previously yellow, continue vital signs every 4 hours (pt was previously yellow at 1318 for HR and RR)  Treat  MEWS Interventions Administered scheduled meds/treatments  Pain Scale 0-10  Pain Score 0  Take Vital Signs  Increase Vital Sign Frequency  Yellow: Q 2hr X 2 then Q 4hr X 2, if remains yellow, continue Q 4hrs  Escalate  MEWS: Escalate Yellow: discuss with charge nurse/RN and consider discussing with provider and RRT  Notify: Charge Nurse/RN  Name of Charge Nurse/RN Notified Vera  Date Charge Nurse/RN Notified 05/06/20  Time Charge Nurse/RN Notified 1922  Notify: Provider  Provider Name/Title  (N/A)  Document  Patient Outcome Stabilized after interventions  Patient was previously yellow at 1318 , became green then turned yellow again at 1922. Patient not in distress, Alert and Oriented. Will follow MEWS protocol. Will continue to monitor

## 2020-05-07 NOTE — Progress Notes (Signed)
PROGRESS NOTE  Kyle Flowers  DOB: 27-Apr-1951  PCP: Hinton Lovely, MD XTG:[id number]  DOA: 05/05/2020  LOS: 2 days   Chief Complaint  Patient presents with  . Shortness of Breath   Brief narrative: Kyle Flowers is a 69 y.o. male with PMH of HTN, HLD, prostate cancer status post radical prostatectomy. Patient presented to the ED on 05/05/2020 with 1 week history of progressively worsening shortness of breath, cough, congestion. 10/5, he was seen by PCP. He was prescribed 5 days of prednisone and 10 days of cefdinir for bronchitis and sinus congestion.  He went for a follow-up on 10/8 with persistent symptoms.  Covid antigen test was negative.  He was sent to ED for persistent symptoms. Not been vaccinated for COVID.  In the ED, patient had a temperature of 103.1, tachycardic to 127, tachypneic to 32.  Respiratory distress improved after he was started on 5 L of oxygen by nasal cannula. COVID-19 PCR positive. Labs with CRP elevated to 16.8, WBC 12.5, procalcitonin 0.23 Chest x-ray showed airspace opacity consistent with multifocal pneumonia on the right in the mid and lower lung regions with increase in consolidation compared to chest x-ray from 2 days prior. Patient was admitted to hospital service for further evaluation management.  Subjective: Patient was seen and examined this morning.   Lying on the bed on his side.   Requiring 15 L of oxygen this morning just at rest. Patient himself does not complain of any new problem.  Significantly hard of hearing.  Assessment/Plan: COVID pneumonia Acute respiratory failure with hypoxia  -Presented with worsening shortness of breath -COVID test: PCR positive -Chest imaging: Multifocal pneumonia on the right -Treatment: Currently on 5-day course of IV remdesivir to complete on 10/12 and IV Solu-Medrol. -Oxygen - SpO2: (!) 87 % O2 Flow Rate (L/min): 15 L/min -Patient's oxygen demand is increasing.  He has bilateral fine crackles  all over.  We will increase IV Solu-Medrol to 60 mg 3 times daily.  Also put him on scheduled IV Lasix 40 mg daily. -CRP is improving.  Hopefully his oxygen demand improved in next 24 hours, if not we need to consider to give Actemra or baricitinib. -Supportive care: Vitamin C, Zinc, PRN inhalers, Tylenol, Antitussives (benzonatate/ Mucinex/Tussionex). -Encouraged incentive spirometry, prone position, out of bed and early mobilization as much as possible -Continue airborne/contact isolation precautions for duration of 3 weeks from the day of diagnosis. -WBC and inflammatory markers trend as below.   Lab Results  Component Value Date   SARSCOV2NAA POSITIVE (A) 05/05/2020   [place] NEGATIVE 02/16/2019    Recent Labs  Lab 05/05/20 1225 05/05/20 1247 05/05/20 1445 05/06/20 0417 05/07/20 0420  WBC 12.5*  --   --  13.4* 16.5*  LATICACIDVEN 1.1  --  1.1  --   --   PROCALCITON 0.23  --   --   --   --   DDIMER  --  0.77*  --  0.64* 0.41  FERRITIN 1,186*  --   --  1,125* 1,266*  LDH 331*  --   --   --   --   CRP 16.8*  --   --  21.9* 16.6*  ALT 35  --   --  30 37   The treatment plan and use of medications and known side effects were discussed with patient/family. Some of the medications used are based on case reports/anecdotal data.  All other medications being used in the management of COVID-19 based on  limited study data.  Complete risks and long-term side effects are unknown, however in the best clinical judgment they seem to be of some benefit. Patient wanted to proceed with treatment options provided.  Possible secondary bacterial pneumonia -Procalcitonin elevated.  Continue empiric IV Rocephin and IV azithromycin.  Repeat procalcitonin tomorrow.   Essential hypertension -Blood pressure stable on lisinopril  Hyperlipidemia -Continue statin  H/o prostate cancer  -status post radical prostatectomy  Mobility: Encourage ambulation Code Status:   Code Status: Full Code    Nutritional status: Body mass index is 29.92 kg/m.     Diet Order            Diet regular Room service appropriate? Yes; Fluid consistency: Thin  Diet effective now                 DVT prophylaxis: enoxaparin (LOVENOX) injection 40 mg Start: 05/05/20 2200   Antimicrobials:  IV Rocephin, IV azithromycin Fluid: Not on IV fluid Consultants: None Family Communication:  I called and updated patient's wife this afternoon.  Status is: Inpatient  Remains inpatient appropriate because: Covid pneumonia on IV treatment.  Dispo: The patient is from: Home              Anticipated d/c is to: Home hopefully              Anticipated d/c date is: > 3 days              Patient currently is not medically stable to d/c.  Infusions:  . azithromycin Stopped (05/06/20 1449)  . cefTRIAXone (ROCEPHIN)  IV Stopped (05/06/20 1327)  . remdesivir 100 mg in NS 100 mL 100 mg (05/07/20 1146)    Scheduled Meds: . vitamin C  500 mg Oral Daily  . aspirin EC  81 mg Oral QHS  . enoxaparin (LOVENOX) injection  40 mg Subcutaneous Q24H  . furosemide  40 mg Intravenous Daily  . insulin aspart  0-5 Units Subcutaneous QHS  . insulin aspart  0-9 Units Subcutaneous TID WC  . lisinopril  20 mg Oral QPM  . methylPREDNISolone (SOLU-MEDROL) injection  60 mg Intravenous Q8H  . pravastatin  40 mg Oral q1800  . zinc sulfate  220 mg Oral Daily    Antimicrobials: Anti-infectives (From admission, onward)   Start     Dose/Rate Route Frequency Ordered Stop   05/06/20 1000  remdesivir 100 mg in sodium chloride 0.9 % 100 mL IVPB       "Followed by" Linked Group Details   100 mg 200 mL/hr over 30 Minutes Intravenous Daily 05/05/20 1447 05/10/20 0959   05/05/20 1600  remdesivir 200 mg in sodium chloride 0.9% 250 mL IVPB       "Followed by" Linked Group Details   200 mg 580 mL/hr over 30 Minutes Intravenous Once 05/05/20 1447 05/05/20 1651   05/05/20 1300  cefTRIAXone (ROCEPHIN) 2 g in sodium chloride 0.9 % 100 mL  IVPB        2 g 200 mL/hr over 30 Minutes Intravenous Every 24 hours 05/05/20 1255     05/05/20 1300  azithromycin (ZITHROMAX) 500 mg in sodium chloride 0.9 % 250 mL IVPB        500 mg 250 mL/hr over 60 Minutes Intravenous Every 24 hours 05/05/20 1255        PRN meds: acetaminophen, albuterol, chlorpheniramine-HYDROcodone, guaiFENesin-dextromethorphan, ondansetron **OR** ondansetron (ZOFRAN) IV   Objective: Vitals:   05/07/20 0800 05/07/20 0815  BP:    Pulse:  Resp:    Temp:    SpO2: (!) 83% (!) 87%    Intake/Output Summary (Last 24 hours) at 05/07/2020 1201 Last data filed at 05/06/2020 2334 Gross per 24 hour  Intake 1073.55 ml  Output 1350 ml  Net -276.45 ml   Filed Weights   05/05/20 1748  Weight: 94.6 kg   Weight change:  Body mass index is 29.92 kg/m.   Physical Exam: General exam: Appears calm and comfortable.  Looks more tired today Skin: No rashes, lesions or ulcers. HEENT: Atraumatic, normocephalic, supple neck, no obvious bleeding Lungs: Bilateral scattered fine crackles CVS: Regular rate and rhythm, no murmur GI/Abd soft, nontender, nondistended, bowel sound present CNS: Sleeping, opens eyes on verbal command.  Able to answer simple questions. hard of hearing Psychiatry: Mood appropriate Extremities: No pedal edema, no calf tenderness  Data Review: I have personally reviewed the laboratory data and studies available.  Recent Labs  Lab 05/05/20 1225 05/06/20 0417 05/07/20 0420  WBC 12.5* 13.4* 16.5*  NEUTROABS 10.7* 12.1* 15.1*  HGB 16.0 14.3 14.6  HCT 48.7 45.2 45.7  MCV 95.3 97.4 97.6  PLT 214 175 276   Recent Labs  Lab 05/05/20 1225 05/06/20 0417 05/07/20 0420  NA 134* 134* 139  K 4.4 4.7 4.9  CL 96* 99 100  CO2 26 24 30   GLUCOSE 133* 136* 175*  BUN 22 17 23   CREATININE 1.04 0.63 0.81  CALCIUM 8.2* 7.6* 7.9*    F/u labs ordered  Signed, Terrilee Croak, MD [facility] 05/07/2020

## 2020-05-07 NOTE — Progress Notes (Addendum)
0500 patient got up to [facility] to have BM with 9L HFNC, desat down to mid 70s with dyspnea . Patient back in bed increased oxygen in increments of 2 liters up to 14 liters over period of 10 min to achieve sats of 86%. Patient is currently resting on his side. Will continue to monitor.   0647: Patient able to tolerate decreased O2 of 8L via HFNC , saturation currently at 88%. Patient is currently sleeping at this time. Will continue to monitor.

## 2020-05-08 LAB — CBC WITH DIFFERENTIAL/PLATELET
Abs Immature Granulocytes: 0.3 10*3/uL — ABNORMAL HIGH (ref 0.00–0.07)
Basophils Absolute: 0 10*3/uL (ref 0.0–0.1)
Basophils Relative: 0 %
Eosinophils Absolute: 0 10*3/uL (ref 0.0–0.5)
Eosinophils Relative: 0 %
HCT: 48.8 % (ref 39.0–52.0)
Hemoglobin: 15.2 g/dL (ref 13.0–17.0)
Immature Granulocytes: 2 %
Lymphocytes Relative: 4 %
Lymphs Abs: 0.7 10*3/uL (ref 0.7–4.0)
MCH: 30.3 pg (ref 26.0–34.0)
MCHC: 31.1 g/dL (ref 30.0–36.0)
MCV: 97.4 fL (ref 80.0–100.0)
Monocytes Absolute: 0.4 10*3/uL (ref 0.1–1.0)
Monocytes Relative: 3 %
Neutro Abs: 15.4 10*3/uL — ABNORMAL HIGH (ref 1.7–7.7)
Neutrophils Relative %: 91 %
Platelets: 328 10*3/uL (ref 150–400)
RBC: 5.01 MIL/uL (ref 4.22–5.81)
RDW: 14 % (ref 11.5–15.5)
WBC: 16.9 10*3/uL — ABNORMAL HIGH (ref 4.0–10.5)
nRBC: 0.1 % (ref 0.0–0.2)

## 2020-05-08 LAB — COMPREHENSIVE METABOLIC PANEL
ALT: 33 U/L (ref 0–44)
AST: 32 U/L (ref 15–41)
Albumin: 2.6 g/dL — ABNORMAL LOW (ref 3.5–5.0)
Alkaline Phosphatase: 43 U/L (ref 38–126)
Anion gap: 10 (ref 5–15)
BUN: 30 mg/dL — ABNORMAL HIGH (ref 8–23)
CO2: 31 mmol/L (ref 22–32)
Calcium: 7.8 mg/dL — ABNORMAL LOW (ref 8.9–10.3)
Chloride: 100 mmol/L (ref 98–111)
Creatinine, Ser: 0.89 mg/dL (ref 0.61–1.24)
GFR, Estimated: >60 mL/min (ref >60)
Glucose, Bld: 173 mg/dL — ABNORMAL HIGH (ref 70–99)
Potassium: 4.6 mmol/L (ref 3.5–5.1)
Sodium: 141 mmol/L (ref 135–145)
Total Bilirubin: 0.7 mg/dL (ref 0.3–1.2)
Total Protein: 6.3 g/dL — ABNORMAL LOW (ref 6.5–8.1)

## 2020-05-08 LAB — GLUCOSE, CAPILLARY
Glucose-Capillary: 151 mg/dL — ABNORMAL HIGH (ref 70–99)
Glucose-Capillary: 357 mg/dL — ABNORMAL HIGH (ref 70–99)
Glucose-Capillary: 265 mg/dL — ABNORMAL HIGH (ref 70–99)
Glucose-Capillary: 270 mg/dL — ABNORMAL HIGH (ref 70–99)

## 2020-05-08 LAB — FERRITIN: Ferritin: 893 ng/mL — ABNORMAL HIGH (ref 24–336)

## 2020-05-08 LAB — D-DIMER, QUANTITATIVE: D-Dimer, Quant: 0.37 ug/mL-FEU (ref 0.00–0.50)

## 2020-05-08 LAB — C-REACTIVE PROTEIN: CRP: 8.4 mg/dL — ABNORMAL HIGH (ref <1.0)

## 2020-05-08 LAB — PROCALCITONIN: Procalcitonin: <0.1 ng/mL

## 2020-05-08 MED ORDER — INSULIN GLARGINE 100 UNIT/ML ~~LOC~~ SOLN
5.0000 [IU] | Freq: Every day | SUBCUTANEOUS | Status: DC
  Administered 2020-05-08 – 2020-05-09 (×2): 5 [IU] via SUBCUTANEOUS
  Filled 2020-05-08 (×2): qty 0.05

## 2020-05-08 NOTE — Progress Notes (Signed)
PT Cancellation Note  Patient Details Name: Kyle Flowers MRN: [id number] DOB: 05-06-1951   Cancelled Treatment:    Reason Eval/Treat Not Completed: Medical issues which prohibited therapy, just got off of BSC and too fatigued. Will check back another time.   Claretha Cooper 05/08/2020, 12:24 PM Tresa Endo PT Acute Rehabilitation Services Pager [phone] Office [phone]

## 2020-05-08 NOTE — Evaluation (Signed)
Occupational Therapy Evaluation Patient Details Name: Kyle Flowers MRN: [id number] DOB: 1950/09/18 Today's Date: 05/08/2020    History of Present Illness Patient is a 69 year old male PMH of HTN, HLD, prostate cancer status post radical prostatectomy. Patient presented to the ED on 05/05/2020 with 1 week history of progressively worsening shortness of breath, cough ,congestion. Patient tested positive COVID in ED.    Clinical Impression   Patient with functional deficits listed below impacting safety and independence with self care. Patient typically I, is a Administrator. Upon arrival patient seated on commode, patient min A for transfer from [facility] to recliner and total A for peri care. Patient desat to low 80s on 15L requiring increased time for recovery with cues + encouragement in PLB strategies and utilization of IS. Patient use IS x3 before coughing and declined further use at this time. Also declined seated exercises "I don't have the energy right now." Educate patient on seated exercises to perform later when feels he has recovered. Recommend continued acute OT services to improve activity tolerance and cardiopulmonary status in order to D/C to venue listed below.    Follow Up Recommendations  No OT follow up    Equipment Recommendations  None recommended by OT       Precautions / Restrictions Precautions Precautions: Fall Precaution Comments: monitor O2 Restrictions Weight Bearing Restrictions: No      Mobility Bed Mobility               General bed mobility comments: OOB upon arrival  Transfers Overall transfer level: Needs assistance Equipment used: None Transfers: Sit to/from [facility] Sit to Stand: Min assist Stand pivot transfers: Min assist       General transfer comment: for stability, decreased activity tolerance    Balance Overall balance assessment: Needs assistance Sitting-balance support: Feet supported Sitting balance-Leahy  Scale: Fair     Standing balance support: Single extremity supported Standing balance-Leahy Scale: Poor Standing balance comment: holding onto arm of recliner to complete stand pivot transfer                           ADL either performed or assessed with clinical judgement   ADL Overall ADL's : Needs assistance/impaired     Grooming: Sitting;Supervision/safety   Upper Body Bathing: Supervision/ safety;Sitting   Lower Body Bathing: Minimal assistance;Sit to/from stand   Upper Body Dressing : Supervision/safety;Sitting   Lower Body Dressing: Supervision/safety;Minimal assistance;Sitting/lateral leans;Sit to/from stand Lower Body Dressing Details (indicate cue type and reason): seated patient able to doff/don sock with increased time Toilet Transfer: Minimal assistance;BSC;Stand-pivot Toilet Transfer Details (indicate cue type and reason): patient stood from bedside commode and transfer to recliner with min A, decreased activity tolerance  Toileting- Clothing Manipulation and Hygiene: Total assistance;Sit to/from stand Toileting - Clothing Manipulation Details (indicate cue type and reason): s/p bowel movement     Functional mobility during ADLs: Minimal assistance General ADL Comments: patient fatigued transfer from [facility] to recliner, declining further activity or seated exercise                  Pertinent Vitals/Pain Pain Assessment: No/denies pain     Hand Dominance Right   Extremity/Trunk Assessment Upper Extremity Assessment Upper Extremity Assessment: Generalized weakness   Lower Extremity Assessment Lower Extremity Assessment: Defer to PT evaluation       Communication Communication Communication: HOH   Cognition Arousal/Alertness: Awake/alert Behavior During Therapy: [facility] for tasks assessed/performed  Overall Cognitive Status: Within Functional Limits for tasks assessed                                     General Comments   patient on 15L HFNC throughout session with desaturation in low 80s, cue for pursed lip breathing techniques patient state "I can't right now" provide encouragement to utilize incentive spirometer patient perform x3 before coughing and declines further use            Home Living Family/patient expects to be discharged to:: Private residence Living Arrangements: Spouse/significant other Available Help at Discharge: Family Type of Home: House Home Access: Stairs to enter Technical brewer of Steps: 2 Entrance Stairs-Rails: Right Home Layout: One level     Bathroom Shower/Tub: Occupational psychologist: Standard     Home Equipment: Bedside commode;Walker - 2 wheels          Prior Functioning/Environment Level of Independence: Independent        Comments: employed as a Holiday representative Problem List: Decreased strength;Decreased activity tolerance;Impaired balance (sitting and/or standing);Decreased safety awareness;Cardiopulmonary status limiting activity;Decreased knowledge of precautions;Decreased knowledge of use of DME or AE      OT Treatment/Interventions: Self-care/ADL training;Therapeutic exercise;Energy conservation;DME and/or AE instruction;Therapeutic activities;Patient/family education;Balance training    OT Goals(Current goals can be found in the care plan section) Acute Rehab OT Goals Patient Stated Goal: regain energy OT Goal Formulation: With patient Time For Goal Achievement: 05/22/20 Potential to Achieve Goals: Good  OT Frequency: Min 3X/week    AM-PAC OT "6 Clicks" Daily Activity     Outcome Measure Help from another person eating meals?: None Help from another person taking care of personal grooming?: A Little Help from another person toileting, which includes using toliet, bedpan, or urinal?: A Little Help from another person bathing (including washing, rinsing, drying)?: A Little Help from another person to put on and taking  off regular upper body clothing?: A Little Help from another person to put on and taking off regular lower body clothing?: A Little 6 Click Score: 19   End of Session Equipment Utilized During Treatment: Oxygen Nurse Communication: Mobility status  Activity Tolerance: Patient limited by fatigue Patient left: in chair;with call bell/phone within reach  OT Visit Diagnosis: Other abnormalities of gait and mobility (R26.89);Muscle weakness (generalized) (M62.81)                Time: 1683-7290 OT Time Calculation (min): 23 min Charges:  OT General Charges $OT Visit: 1 Visit OT Evaluation $OT Eval Low Complexity: 1 Low OT Treatments $Self Care/Home Management : 8-22 mins  Delbert Phenix OT OT pager: [phone]  Rosemary Holms 05/08/2020, 3:02 PM

## 2020-05-08 NOTE — Progress Notes (Signed)
PROGRESS NOTE  Kyle Flowers  DOB: Sep 05, 1950  PCP: Hinton Lovely, MD GEX:[id number]  DOA: 05/05/2020  LOS: 3 days   Chief Complaint  Patient presents with  . Shortness of Breath   Brief narrative: Kyle Flowers is a 69 y.o. male with PMH of HTN, HLD, prostate cancer status post radical prostatectomy. Patient presented to the ED on 05/05/2020 with 1 week history of progressively worsening shortness of breath, cough, congestion. 10/5, he was seen by PCP. He was prescribed 5 days of prednisone and 10 days of cefdinir for bronchitis and sinus congestion.  He went for a follow-up on 10/8 with persistent symptoms.  Covid antigen test was negative.  He was sent to ED for persistent symptoms. Not been vaccinated for COVID.  In the ED, patient had a temperature of 103.1, tachycardic to 127, tachypneic to 32.  Respiratory distress improved after he was started on 5 L of oxygen by nasal cannula. COVID-19 PCR positive. Labs with CRP elevated to 16.8, WBC 12.5, procalcitonin 0.23 Chest x-ray showed airspace opacity consistent with multifocal pneumonia on the right in the mid and lower lung regions with increase in consolidation compared to chest x-ray from 2 days prior. Patient was admitted to hospital service for further evaluation management.  Subjective: Patient was seen and examined this morning.   Sitting up in chair.  Not in distress.  Continues to require 15 L oxygen by nasal cannula. Blood glucose elevated because of steroids.  Assessment/Plan: COVID pneumonia Acute respiratory failure with hypoxia  -Presented with worsening shortness of breath -COVID test: PCR positive -Chest imaging: Multifocal pneumonia on the right -Treatment: Currently on 5-day course of IV remdesivir to complete on 10/12 and IV Solu-Medrol at a high dose of 60 mg every 8 hours. -Continues to require high flow oxygen but CRP is trending down now. -Oxygen - SpO2: 92 % O2 Flow Rate (L/min): 15  L/min -Supportive care: Vitamin C, Zinc, PRN inhalers, Tylenol, Antitussives (benzonatate/ Mucinex/Tussionex). -Encouraged incentive spirometry, prone position, out of bed and early mobilization as much as possible -Continue airborne/contact isolation precautions for duration of 3 weeks from the day of diagnosis. -WBC and inflammatory markers trend as below.   Lab Results  Component Value Date   SARSCOV2NAA POSITIVE (A) 05/05/2020   [place] NEGATIVE 02/16/2019    Recent Labs  Lab 05/05/20 1225 05/05/20 1247 05/05/20 1445 05/06/20 0417 05/07/20 0420 05/08/20 0434  WBC 12.5*  --   --  13.4* 16.5* 16.9*  LATICACIDVEN 1.1  --  1.1  --   --   --   PROCALCITON 0.23  --   --   --   --  <0.10  DDIMER  --  0.77*  --  0.64* 0.41 0.37  FERRITIN 1,186*  --   --  1,125* 1,266* 893*  LDH 331*  --   --   --   --   --   CRP 16.8*  --   --  21.9* 16.6* 8.4*  ALT 35  --   --  30 37 33   The treatment plan and use of medications and known side effects were discussed with patient/family. Some of the medications used are based on case reports/anecdotal data.  All other medications being used in the management of COVID-19 based on limited study data.  Complete risks and long-term side effects are unknown, however in the best clinical judgment they seem to be of some benefit. Patient wanted to proceed with treatment options provided.  Possible secondary bacterial pneumonia -Procalcitonin elevated.  Continue empiric IV Rocephin and IV azithromycin.  Repeat procalcitonin today is less than 0.1.  Continue antibiotic for short course..   Essential hypertension -Blood pressure stable on lisinopril  Hyperlipidemia -Continue statin  H/o prostate cancer  -status post radical prostatectomy  Mobility: Encourage ambulation Code Status:   Code Status: Full Code  Nutritional status: Body mass index is 29.92 kg/m.     Diet Order            Diet regular Room service appropriate? Yes; Fluid  consistency: Thin  Diet effective now                 DVT prophylaxis: enoxaparin (LOVENOX) injection 40 mg Start: 05/05/20 2200   Antimicrobials:  IV Rocephin, IV azithromycin Fluid: Not on IV fluid Consultants: None Family Communication:  I called and updated patient's wife on 10/10.  Status is: Inpatient  Remains inpatient appropriate because: Covid pneumonia on IV treatment.  Dispo: The patient is from: Home              Anticipated d/c is to: Home hopefully              Anticipated d/c date is: > 3 days              Patient currently is not medically stable to d/c.  Infusions:  . azithromycin 500 mg (05/08/20 1325)  . cefTRIAXone (ROCEPHIN)  IV 2 g (05/08/20 1213)  . remdesivir 100 mg in NS 100 mL 100 mg (05/08/20 0951)    Scheduled Meds: . vitamin C  500 mg Oral Daily  . aspirin EC  81 mg Oral QHS  . enoxaparin (LOVENOX) injection  40 mg Subcutaneous Q24H  . furosemide  40 mg Intravenous Daily  . insulin aspart  0-5 Units Subcutaneous QHS  . insulin aspart  0-9 Units Subcutaneous TID WC  . insulin glargine  5 Units Subcutaneous Daily  . lisinopril  20 mg Oral QPM  . methylPREDNISolone (SOLU-MEDROL) injection  60 mg Intravenous Q8H  . pravastatin  40 mg Oral q1800  . zinc sulfate  220 mg Oral Daily    Antimicrobials: Anti-infectives (From admission, onward)   Start     Dose/Rate Route Frequency Ordered Stop   05/06/20 1000  remdesivir 100 mg in sodium chloride 0.9 % 100 mL IVPB       "Followed by" Linked Group Details   100 mg 200 mL/hr over 30 Minutes Intravenous Daily 05/05/20 1447 05/10/20 0959   05/05/20 1600  remdesivir 200 mg in sodium chloride 0.9% 250 mL IVPB       "Followed by" Linked Group Details   200 mg 580 mL/hr over 30 Minutes Intravenous Once 05/05/20 1447 05/05/20 1651   05/05/20 1300  cefTRIAXone (ROCEPHIN) 2 g in sodium chloride 0.9 % 100 mL IVPB        2 g 200 mL/hr over 30 Minutes Intravenous Every 24 hours 05/05/20 1255     05/05/20  1300  azithromycin (ZITHROMAX) 500 mg in sodium chloride 0.9 % 250 mL IVPB        500 mg 250 mL/hr over 60 Minutes Intravenous Every 24 hours 05/05/20 1255        PRN meds: acetaminophen, albuterol, chlorpheniramine-HYDROcodone, guaiFENesin-dextromethorphan, ondansetron **OR** ondansetron (ZOFRAN) IV   Objective: Vitals:   05/08/20 0424 05/08/20 1330  BP: 132/74 124/75  Pulse: 89 (!) 103  Resp: (!) 24 18  Temp: 97.7 F (36.5 C) 98.4 F (  36.9 C)  SpO2: (!) 89% 92%    Intake/Output Summary (Last 24 hours) at 05/08/2020 1346 Last data filed at 05/08/2020 0700 Gross per 24 hour  Intake 446.45 ml  Output 750 ml  Net -303.55 ml   Filed Weights   05/05/20 1748  Weight: 94.6 kg   Weight change:  Body mass index is 29.92 kg/m.   Physical Exam: General exam: Appears calm and comfortable.  Skin: No rashes, lesions or ulcers. HEENT: Atraumatic, normocephalic, supple neck, no obvious bleeding Lungs: Continues to have bilateral scattered fine crackles CVS: Regular rate and rhythm, no murmur GI/Abd soft, nontender, nondistended, bowel sound present CNS: Alert, awake, oriented x3. Psychiatry: Mood appropriate Extremities: No pedal edema, no calf tenderness  Data Review: I have personally reviewed the laboratory data and studies available.  Recent Labs  Lab 05/05/20 1225 05/06/20 0417 05/07/20 0420 05/08/20 0434  WBC 12.5* 13.4* 16.5* 16.9*  NEUTROABS 10.7* 12.1* 15.1* 15.4*  HGB 16.0 14.3 14.6 15.2  HCT 48.7 45.2 45.7 48.8  MCV 95.3 97.4 97.6 97.4  PLT 214 175 276 328   Recent Labs  Lab 05/05/20 1225 05/06/20 0417 05/07/20 0420 05/08/20 0434  NA 134* 134* 139 141  K 4.4 4.7 4.9 4.6  CL 96* 99 100 100  CO2 26 24 30 31   GLUCOSE 133* 136* 175* 173*  BUN 22 17 23  30*  CREATININE 1.04 0.63 0.81 0.89  CALCIUM 8.2* 7.6* 7.9* 7.8*    F/u labs ordered  Signed, Terrilee Croak, MD [facility] 05/08/2020

## 2020-05-08 NOTE — Care Management Important Message (Signed)
Important Message  Patient Details IM Letter given to the Patient Name: Kyle Flowers MRN: [id number] Date of Birth: Apr 03, 1951   Medicare Important Message Given:  Yes     Kerin Salen 05/08/2020, 11:15 AM

## 2020-05-09 LAB — CBC WITH DIFFERENTIAL/PLATELET
Abs Immature Granulocytes: 0.21 10*3/uL — ABNORMAL HIGH (ref 0.00–0.07)
Basophils Absolute: 0.1 10*3/uL (ref 0.0–0.1)
Basophils Relative: 0 %
Eosinophils Absolute: 0 10*3/uL (ref 0.0–0.5)
Eosinophils Relative: 0 %
HCT: 48 % (ref 39.0–52.0)
Hemoglobin: 15.3 g/dL (ref 13.0–17.0)
Immature Granulocytes: 1 %
Lymphocytes Relative: 4 %
Lymphs Abs: 0.7 10*3/uL (ref 0.7–4.0)
MCH: 31 pg (ref 26.0–34.0)
MCHC: 31.9 g/dL (ref 30.0–36.0)
MCV: 97.2 fL (ref 80.0–100.0)
Monocytes Absolute: 0.5 10*3/uL (ref 0.1–1.0)
Monocytes Relative: 3 %
Neutro Abs: 16.3 10*3/uL — ABNORMAL HIGH (ref 1.7–7.7)
Neutrophils Relative %: 92 %
Platelets: 329 10*3/uL (ref 150–400)
RBC: 4.94 MIL/uL (ref 4.22–5.81)
RDW: 13.9 % (ref 11.5–15.5)
WBC: 17.7 10*3/uL — ABNORMAL HIGH (ref 4.0–10.5)
nRBC: 0 % (ref 0.0–0.2)

## 2020-05-09 LAB — GLUCOSE, CAPILLARY
Glucose-Capillary: 162 mg/dL — ABNORMAL HIGH (ref 70–99)
Glucose-Capillary: 309 mg/dL — ABNORMAL HIGH (ref 70–99)
Glucose-Capillary: 240 mg/dL — ABNORMAL HIGH (ref 70–99)
Glucose-Capillary: 315 mg/dL — ABNORMAL HIGH (ref 70–99)

## 2020-05-09 LAB — FERRITIN: Ferritin: 623 ng/mL — ABNORMAL HIGH (ref 24–336)

## 2020-05-09 LAB — COMPREHENSIVE METABOLIC PANEL
ALT: 33 U/L (ref 0–44)
AST: 31 U/L (ref 15–41)
Albumin: 2.5 g/dL — ABNORMAL LOW (ref 3.5–5.0)
Alkaline Phosphatase: 47 U/L (ref 38–126)
Anion gap: 10 (ref 5–15)
BUN: 30 mg/dL — ABNORMAL HIGH (ref 8–23)
CO2: 30 mmol/L (ref 22–32)
Calcium: 7.5 mg/dL — ABNORMAL LOW (ref 8.9–10.3)
Chloride: 101 mmol/L (ref 98–111)
Creatinine, Ser: 0.94 mg/dL (ref 0.61–1.24)
GFR, Estimated: >60 mL/min (ref >60)
Glucose, Bld: 175 mg/dL — ABNORMAL HIGH (ref 70–99)
Potassium: 4.3 mmol/L (ref 3.5–5.1)
Sodium: 141 mmol/L (ref 135–145)
Total Bilirubin: 1 mg/dL (ref 0.3–1.2)
Total Protein: 6 g/dL — ABNORMAL LOW (ref 6.5–8.1)

## 2020-05-09 LAB — C-REACTIVE PROTEIN: CRP: 4 mg/dL — ABNORMAL HIGH (ref <1.0)

## 2020-05-09 LAB — PROCALCITONIN: Procalcitonin: <0.1 ng/mL

## 2020-05-09 LAB — D-DIMER, QUANTITATIVE: D-Dimer, Quant: 0.7 ug/mL-FEU — ABNORMAL HIGH (ref 0.00–0.50)

## 2020-05-09 LAB — HEMOGLOBIN A1C
Hgb A1c MFr Bld: 6.2 % — ABNORMAL HIGH (ref 4.8–5.6)
Mean Plasma Glucose: 131.24 mg/dL

## 2020-05-09 MED ORDER — INSULIN GLARGINE 100 UNIT/ML ~~LOC~~ SOLN
15.0000 [IU] | Freq: Every day | SUBCUTANEOUS | Status: DC
  Administered 2020-05-09 – 2020-05-11 (×3): 15 [IU] via SUBCUTANEOUS
  Filled 2020-05-09 (×5): qty 0.15

## 2020-05-09 NOTE — Evaluation (Addendum)
Physical Therapy Evaluation Patient Details Name: Kyle Flowers MRN: [id number] DOB: [birth year] Today's Date: 05/09/2020   History of Present Illness  Patient is a 69 year old male PMH of HTN, HLD, prostate cancer status post radical prostatectomy. Patient presented to the ED on 05/05/2020 with 1 week history of progressively worsening shortness of breath, cough ,congestion. Patient tested positive COVID in ED.   Clinical Impression  On eval, pt was Min assist for mobility. He walked ~55 feet around the unit with support of IV pole. Unsteady and requiring assistance intermittently (without UE support). Dyspnea 3/4 with activity. O2 83% on 10L HFNC with ambulation; 91% on 11L at rest EOS. Will plan to follow and progress activity as tolerated.     Follow Up Recommendations Supervision for mobility/OOB    Equipment Recommendations  None recommended by PT    Recommendations for Other Services       Precautions / Restrictions Precautions Precautions: Fall Precaution Comments: monitor O2 Restrictions Weight Bearing Restrictions: No      Mobility  Bed Mobility               General bed mobility comments: oob in recliner  Transfers Overall transfer level: Needs assistance Equipment used: None Transfers: Sit to/from Stand Sit to Stand: Min guard         General transfer comment: Min guard for safety.  Ambulation/Gait Ambulation/Gait assistance: Min assist Gait Distance (Feet): 55 Feet Assistive device: IV Pole;None       General Gait Details: Intermittent assist to steady. Increased posterior sway noted. Dyspnea 3/4. Slow gait speed. Fatigues easily. Denies dizziness. Walked to/from bathroom without UE support.   Stairs            Wheelchair Mobility    Modified Rankin (Stroke Patients Only)       Balance Overall balance assessment: Needs assistance         Standing balance support: During functional activity Standing balance-Leahy Scale: Fair                                Pertinent Vitals/Pain Pain Assessment: No/denies pain    Home Living Family/patient expects to be discharged to:: Private residence Living Arrangements: Spouse/significant other Available Help at Discharge: Family Type of Home: House Home Access: Stairs to enter Entrance Stairs-Rails: Right Entrance Stairs-Number of Steps: 2 Home Layout: One level Home Equipment: Bedside commode;Walker - 2 wheels      Prior Function Level of Independence: Independent         Comments: employed as a Nurse, adult   Dominant Hand: Right    Extremity/Trunk Assessment   Upper Extremity Assessment Upper Extremity Assessment: Defer to OT evaluation    Lower Extremity Assessment Lower Extremity Assessment: Generalized weakness    Cervical / Trunk Assessment Cervical / Trunk Assessment: Normal  Communication   Communication: HOH  Cognition Arousal/Alertness: Awake/alert Behavior During Therapy: WFL for tasks assessed/performed Overall Cognitive Status: Within Functional Limits for tasks assessed Area of Impairment: Safety/judgement                         Safety/Judgement: Decreased awareness of safety     General Comments: but demonstrates decreased safety awareness at times      General Comments      Exercises     Assessment/Plan    PT Assessment Patient needs continued PT services  PT Problem List Decreased mobility;Decreased balance;Decreased activity tolerance;Decreased safety awareness       PT Treatment Interventions Gait training;Therapeutic activities;Therapeutic exercise;Patient/family education;DME instruction;Balance training;Functional mobility training    PT Goals (Current goals can be found in the Care Plan section)  Acute Rehab PT Goals Patient Stated Goal: regain energy PT Goal Formulation: With patient Time For Goal Achievement: 05/23/20 Potential to Achieve Goals: Good     Frequency Min 3X/week   Barriers to discharge        Co-evaluation               AM-PAC PT "6 Clicks" Mobility  Outcome Measure Help needed turning from your back to your side while in a flat bed without using bedrails?: None Help needed moving from lying on your back to sitting on the side of a flat bed without using bedrails?: None Help needed moving to and from a bed to a chair (including a wheelchair)?: A Little Help needed standing up from a chair using your arms (e.g., wheelchair or bedside chair)?: A Little Help needed to walk in hospital room?: A Little Help needed climbing 3-5 steps with a railing? : A Little 6 Click Score: 20    End of Session Equipment Utilized During Treatment: Gait belt;Oxygen Activity Tolerance: Patient limited by fatigue Patient left: in chair;with call bell/phone within reach   PT Visit Diagnosis: Unsteadiness on feet (R26.81);Difficulty in walking, not elsewhere classified (R26.2)    Time: 1102-1117 PT Time Calculation (min) (ACUTE ONLY): 19 min   Charges:   PT Evaluation $PT Eval Low Complexity: [place], PT Acute Rehabilitation  Office: [phone] Pager: [phone]

## 2020-05-09 NOTE — Progress Notes (Addendum)
PROGRESS NOTE  Kyle Flowers  DOB: 04-Aug-1950  PCP: Hinton Lovely, MD MEQ:[id number]  DOA: 05/05/2020  LOS: 4 days   Chief Complaint  Patient presents with  . Shortness of Breath   Brief narrative: Kyle Flowers is a 69 y.o. male with PMH of HTN, HLD, prostate cancer status post radical prostatectomy. Patient presented to the ED on 05/05/2020 with 1 week history of progressively worsening shortness of breath, cough, congestion. 10/5, he was seen by PCP. He was prescribed 5 days of prednisone and 10 days of cefdinir for bronchitis and sinus congestion.  He went for a follow-up on 10/8 with persistent symptoms.  Covid antigen test was negative.  He was sent to ED for persistent symptoms. Not been vaccinated for COVID.  In the ED, patient had a temperature of 103.1, tachycardic to 127, tachypneic to 32.  Respiratory distress improved after he was started on 5 L of oxygen by nasal cannula. COVID-19 PCR positive. Labs with CRP elevated to 16.8, WBC 12.5, procalcitonin 0.23 Chest x-ray showed airspace opacity consistent with multifocal pneumonia on the right in the mid and lower lung regions with increase in consolidation compared to chest x-ray from 2 days prior. Patient was admitted to hospital service for further evaluation management.  For last 3 days since admission, patient's oxygen demand increased to 15 L/min and has stabilized for the last 24 hours.  Subjective: Patient was seen and examined this morning.   Sitting up in chair.  Not in distress.   Continues to be on 15 L oxygen by nasal cannula.  Assessment/Plan: COVID pneumonia Acute respiratory failure with hypoxia  -Presented with worsening shortness of breath -COVID test: PCR positive -Chest imaging: Multifocal pneumonia on the right -Treatment: Completed 5-day course of IV remdesivir today.  Currently on IV Solu-Medrol at a high dose of 60 mg every 8 hours.  Also on Lasix 40 mg daily. -Supportive care: Vitamin  C, Zinc, PRN inhalers, Tylenol, Antitussives (benzonatate/ Mucinex/Tussionex). -Encouraged incentive spirometry, prone position, out of bed and early mobilization as much as possible -Continue airborne/contact isolation precautions for duration of 3 weeks from the day of diagnosis. -WBC and inflammatory markers trend as below.  CRP trending down.  Lab Results  Component Value Date   SARSCOV2NAA POSITIVE (A) 05/05/2020   [place] NEGATIVE 02/16/2019    Recent Labs  Lab 05/05/20 1225 05/05/20 1247 05/05/20 1445 05/06/20 0417 05/07/20 0420 05/08/20 0434 05/09/20 0418  WBC 12.5*  --   --  13.4* 16.5* 16.9* 17.7*  LATICACIDVEN 1.1  --  1.1  --   --   --   --   PROCALCITON 0.23  --   --   --   --  <0.10 <0.10  DDIMER  --  0.77*  --  0.64* 0.41 0.37 0.70*  FERRITIN 1,186*  --   --  1,125* 1,266* 893* 623*  LDH 331*  --   --   --   --   --   --   CRP 16.8*  --   --  21.9* 16.6* 8.4* 4.0*  ALT 35  --   --  30 37 33 33   The treatment plan and use of medications and known side effects were discussed with patient/family. Some of the medications used are based on case reports/anecdotal data.  All other medications being used in the management of COVID-19 based on limited study data.  Complete risks and long-term side effects are unknown, however in the best clinical  judgment they seem to be of some benefit. Patient wanted to proceed with treatment options provided.  Hyperglycemia -No history of diabetes. -Blood sugar running high because of high-dose steroids. -Increase Lantus to 15 units nightly.  Continue sliding scale insulin with Accu-Cheks. -Obtain A1c. Recent Labs  Lab 05/08/20 1116 05/08/20 1556 05/08/20 2016 05/09/20 0734 05/09/20 1111  GLUCAP 357* 265* 270* 162* 309*    Possible secondary bacterial pneumonia -Procalcitonin was elevated on admission.  Eventually trended down with antibiotics.  To complete 5-day course of empiric IV Rocephin and IV azithromycin  today.  Essential hypertension -Blood pressure stable on lisinopril and IV Lasix.  Hyperlipidemia -Continue statin  H/o prostate cancer  -status post radical prostatectomy  Mobility: Encourage ambulation Code Status:   Code Status: Full Code  Nutritional status: Body mass index is 29.92 kg/m.     Diet Order            Diet regular Room service appropriate? Yes; Fluid consistency: Thin  Diet effective now                 DVT prophylaxis: enoxaparin (LOVENOX) injection 40 mg Start: 05/05/20 2200   Antimicrobials:  Last day of IV Rocephin, IV azithromycin Fluid: Not on IV fluid Consultants: None Family Communication:  Family updated by nursing.  Last I talked to patient's wife was on 10/10  Status is: Inpatient  Remains inpatient appropriate because: Covid pneumonia on IV treatment.  Dispo: The patient is from: Home              Anticipated d/c is to: Home hopefully              Anticipated d/c date is: > 3 days              Patient currently is not medically stable to d/c.  Infusions:  . azithromycin 500 mg (05/09/20 1300)  . cefTRIAXone (ROCEPHIN)  IV 2 g (05/09/20 1200)    Scheduled Meds: . vitamin C  500 mg Oral Daily  . aspirin EC  81 mg Oral QHS  . enoxaparin (LOVENOX) injection  40 mg Subcutaneous Q24H  . furosemide  40 mg Intravenous Daily  . insulin aspart  0-5 Units Subcutaneous QHS  . insulin aspart  0-9 Units Subcutaneous TID WC  . insulin glargine  5 Units Subcutaneous Daily  . lisinopril  20 mg Oral QPM  . methylPREDNISolone (SOLU-MEDROL) injection  60 mg Intravenous Q8H  . pravastatin  40 mg Oral q1800  . zinc sulfate  220 mg Oral Daily    Antimicrobials: Anti-infectives (From admission, onward)   Start     Dose/Rate Route Frequency Ordered Stop   05/06/20 1000  remdesivir 100 mg in sodium chloride 0.9 % 100 mL IVPB       "Followed by" Linked Group Details   100 mg 200 mL/hr over 30 Minutes Intravenous Daily 05/05/20 1447 05/09/20 1120    05/05/20 1600  remdesivir 200 mg in sodium chloride 0.9% 250 mL IVPB       "Followed by" Linked Group Details   200 mg 580 mL/hr over 30 Minutes Intravenous Once 05/05/20 1447 05/05/20 1651   05/05/20 1300  cefTRIAXone (ROCEPHIN) 2 g in sodium chloride 0.9 % 100 mL IVPB        2 g 200 mL/hr over 30 Minutes Intravenous Every 24 hours 05/05/20 1255     05/05/20 1300  azithromycin (ZITHROMAX) 500 mg in sodium chloride 0.9 % 250 mL IVPB  500 mg 250 mL/hr over 60 Minutes Intravenous Every 24 hours 05/05/20 1255        PRN meds: acetaminophen, albuterol, chlorpheniramine-HYDROcodone, guaiFENesin-dextromethorphan, ondansetron **OR** ondansetron (ZOFRAN) IV   Objective: Vitals:   05/09/20 0451 05/09/20 1200  BP: 128/81   Pulse: 94   Resp: 18   Temp: 97.7 F (36.5 C)   SpO2: 96% 91%    Intake/Output Summary (Last 24 hours) at 05/09/2020 1327 Last data filed at 05/09/2020 1223 Gross per 24 hour  Intake 1062 ml  Output 450 ml  Net 612 ml   Filed Weights   05/05/20 1748  Weight: 94.6 kg   Weight change:  Body mass index is 29.92 kg/m.   Physical Exam: General exam: Appears calm and comfortable.  Not in physical distress Skin: No rashes, lesions or ulcers. HEENT: Atraumatic, normocephalic, supple neck, no obvious bleeding Lungs: Continues to have fine bilateral scattered crackles CVS: Regular rate and rhythm, no murmur GI/Abd soft, nontender, nondistended, bowel sound present CNS: Alert, awake, oriented x3 Psychiatry: Mood appropriate Extremities: No pedal edema, no calf tenderness  Data Review: I have personally reviewed the laboratory data and studies available.  Recent Labs  Lab 05/05/20 1225 05/06/20 0417 05/07/20 0420 05/08/20 0434 05/09/20 0418  WBC 12.5* 13.4* 16.5* 16.9* 17.7*  NEUTROABS 10.7* 12.1* 15.1* 15.4* 16.3*  HGB 16.0 14.3 14.6 15.2 15.3  HCT 48.7 45.2 45.7 48.8 48.0  MCV 95.3 97.4 97.6 97.4 97.2  PLT 214 175 276 328 329   Recent Labs   Lab 05/05/20 1225 05/06/20 0417 05/07/20 0420 05/08/20 0434 05/09/20 0418  NA 134* 134* 139 141 141  K 4.4 4.7 4.9 4.6 4.3  CL 96* 99 100 100 101  CO2 26 24 30 31 30   GLUCOSE 133* 136* 175* 173* 175*  BUN 22 17 23  30* 30*  CREATININE 1.04 0.63 0.81 0.89 0.94  CALCIUM 8.2* 7.6* 7.9* 7.8* 7.5*    F/u labs ordered  Signed, Terrilee Croak, MD [facility] 05/09/2020

## 2020-05-10 DIAGNOSIS — U071 COVID-19: Secondary | ICD-10-CM | POA: Diagnosis not present

## 2020-05-10 LAB — COMPREHENSIVE METABOLIC PANEL
ALT: 32 U/L (ref 0–44)
AST: 23 U/L (ref 15–41)
Albumin: 2.6 g/dL — ABNORMAL LOW (ref 3.5–5.0)
Alkaline Phosphatase: 45 U/L (ref 38–126)
Anion gap: 8 (ref 5–15)
BUN: 30 mg/dL — ABNORMAL HIGH (ref 8–23)
CO2: 34 mmol/L — ABNORMAL HIGH (ref 22–32)
Calcium: 7.8 mg/dL — ABNORMAL LOW (ref 8.9–10.3)
Chloride: 100 mmol/L (ref 98–111)
Creatinine, Ser: 0.76 mg/dL (ref 0.61–1.24)
GFR, Estimated: >60 mL/min (ref >60)
Glucose, Bld: 196 mg/dL — ABNORMAL HIGH (ref 70–99)
Potassium: 4.3 mmol/L (ref 3.5–5.1)
Sodium: 142 mmol/L (ref 135–145)
Total Bilirubin: 1 mg/dL (ref 0.3–1.2)
Total Protein: 6.2 g/dL — ABNORMAL LOW (ref 6.5–8.1)

## 2020-05-10 LAB — CBC WITH DIFFERENTIAL/PLATELET
Abs Immature Granulocytes: 0.23 10*3/uL — ABNORMAL HIGH (ref 0.00–0.07)
Basophils Absolute: 0 10*3/uL (ref 0.0–0.1)
Basophils Relative: 0 %
Eosinophils Absolute: 0 10*3/uL (ref 0.0–0.5)
Eosinophils Relative: 0 %
HCT: 47.8 % (ref 39.0–52.0)
Hemoglobin: 15.4 g/dL (ref 13.0–17.0)
Immature Granulocytes: 1 %
Lymphocytes Relative: 4 %
Lymphs Abs: 0.8 10*3/uL (ref 0.7–4.0)
MCH: 31.4 pg (ref 26.0–34.0)
MCHC: 32.2 g/dL (ref 30.0–36.0)
MCV: 97.4 fL (ref 80.0–100.0)
Monocytes Absolute: 0.7 10*3/uL (ref 0.1–1.0)
Monocytes Relative: 4 %
Neutro Abs: 16.3 10*3/uL — ABNORMAL HIGH (ref 1.7–7.7)
Neutrophils Relative %: 91 %
Platelets: 312 10*3/uL (ref 150–400)
RBC: 4.91 MIL/uL (ref 4.22–5.81)
RDW: 13.8 % (ref 11.5–15.5)
WBC: 18 10*3/uL — ABNORMAL HIGH (ref 4.0–10.5)
nRBC: 0 % (ref 0.0–0.2)

## 2020-05-10 LAB — GLUCOSE, CAPILLARY
Glucose-Capillary: 155 mg/dL — ABNORMAL HIGH (ref 70–99)
Glucose-Capillary: 331 mg/dL — ABNORMAL HIGH (ref 70–99)
Glucose-Capillary: 271 mg/dL — ABNORMAL HIGH (ref 70–99)
Glucose-Capillary: 248 mg/dL — ABNORMAL HIGH (ref 70–99)

## 2020-05-10 LAB — C-REACTIVE PROTEIN: CRP: 1.9 mg/dL — ABNORMAL HIGH (ref <1.0)

## 2020-05-10 LAB — CULTURE, BLOOD (ROUTINE X 2)
Culture: NO GROWTH
Culture: NO GROWTH
Special Requests: ADEQUATE
Special Requests: ADEQUATE

## 2020-05-10 LAB — D-DIMER, QUANTITATIVE: D-Dimer, Quant: 0.64 ug/mL-FEU — ABNORMAL HIGH (ref 0.00–0.50)

## 2020-05-10 LAB — FERRITIN: Ferritin: 656 ng/mL — ABNORMAL HIGH (ref 24–336)

## 2020-05-10 MED ORDER — INSULIN ASPART 100 UNIT/ML ~~LOC~~ SOLN
3.0000 [IU] | Freq: Three times a day (TID) | SUBCUTANEOUS | Status: DC
  Administered 2020-05-10 – 2020-05-12 (×6): 3 [IU] via SUBCUTANEOUS

## 2020-05-10 NOTE — Progress Notes (Signed)
Inpatient Diabetes Program Recommendations  AACE/ADA: New Consensus Statement on Inpatient Glycemic Control (2015)  Target Ranges:  Prepandial:   less than 140 mg/dL      Peak postprandial:   less than 180 mg/dL (1-2 hours)      Critically ill patients:  140 - 180 mg/dL   Lab Results  Component Value Date   GLUCAP 331 (H) 05/10/2020   HGBA1C 6.2 (H) 05/09/2020    Review of Glycemic Control  Diabetes history: None Outpatient Diabetes medications: None Current orders for Inpatient glycemic control: Lantus 15 units QHS, Novolog 0-9 units tidwc and hs  HgbA1C - 6.2% - indicates pre-diabetes. Recently on Pred 20 mg bid X 5 days On Solumedrol 60 mg Q8H Post-prandials elevated   Inpatient Diabetes Program Recommendations:     Add Novolog 3 units tidwc for meal coverage insulin  Follow closely.  Thank you. Lorenda Peck, RD, LDN, CDE Inpatient Diabetes Coordinator [phone]

## 2020-05-10 NOTE — Progress Notes (Signed)
PROGRESS NOTE  Kyle Flowers NLG:[id number] DOB: [birth year] DOA: 05/05/2020 PCP: Hinton Lovely, MD   LOS: 5 days   Brief Narrative / Interim history: 69 year old male with HTN, HLD, prostate cancer status post radical prostatectomy, very hard of hearing, came into the hospital and was admitted on 10/8 with 1 week history of shortness of breath, cough, chest congestion.  In the ED he was febrile, tachycardic, tachypneic, COVID-19 was positive and a chest x-ray showed multifocal pneumonia.  He was also hypoxic requiring supplemental oxygen.  Subjective / 24h Interval events: Sitting in chair, no significant complaints for me, denies any shortness of breath and states that his breathing is "fine"  Assessment & Plan:  Principal Problem Acute Hypoxic Respiratory Failure due to Covid-19 Viral Illness -Was on 15 L at 1 point, appears to be on 4 L today, gradually improving -Completed 5 days of remdesivir as well as empiric antibiotics as below -Continue steroids, plan for total of 10 days -Continue to wean off oxygen as tolerated  COVID-19 Labs  Recent Labs    05/08/20 0434 05/09/20 0418 05/10/20 0430  DDIMER 0.37 0.70* 0.64*  FERRITIN 893* 623* 656*  CRP 8.4* 4.0* 1.9*    Lab Results  Component Value Date   SARSCOV2NAA POSITIVE (A) 05/05/2020   [place] NEGATIVE 02/16/2019    Active Problems Prediabetes with hyperglycemia -Hemoglobin A1c 6.2.  Patient was started on Lantus as well as sliding scale, he is hyperglycemic due to steroids. -CBG is still quite high, add mealtime insulin  CBG (last 3)  Recent Labs    05/09/20 1931 05/10/20 0723 05/10/20 1115  GLUCAP 315* 155* 331*    Possible secondary bacterial pneumonia -Completed 5 days of ceftriaxone azithromycin  Essential hypertension -Blood pressure stable on lisinopril, Lasix, continue  Hyperlipidemia -Continue statin  History of prostate cancer -Status post radical prostatectomy  Scheduled  Meds: . vitamin C  500 mg Oral Daily  . aspirin EC  81 mg Oral QHS  . enoxaparin (LOVENOX) injection  40 mg Subcutaneous Q24H  . furosemide  40 mg Intravenous Daily  . insulin aspart  0-5 Units Subcutaneous QHS  . insulin aspart  0-9 Units Subcutaneous TID WC  . insulin glargine  15 Units Subcutaneous QHS  . lisinopril  20 mg Oral QPM  . methylPREDNISolone (SOLU-MEDROL) injection  60 mg Intravenous Q8H  . pravastatin  40 mg Oral q1800  . zinc sulfate  220 mg Oral Daily   Continuous Infusions: PRN Meds:.acetaminophen, albuterol, chlorpheniramine-HYDROcodone, guaiFENesin-dextromethorphan, ondansetron **OR** ondansetron (ZOFRAN) IV  DVT prophylaxis: Lovenox Code Status: Full code Family Communication: Wife Ronin Rehfeldt, [phone] no answer, called Fara Chute [phone]  Status is: Inpatient  Remains inpatient appropriate because:Inpatient level of care appropriate due to severity of illness  Dispo: The patient is from: Home              Anticipated d/c is to: Home              Anticipated d/c date is: 2 days              Patient currently is not medically stable to d/c.  Consultants:  None   Procedures:  None   Microbiology: None   Antibacterials: Ceftriaxone / Azithromycin 10/8 - 10/12  Objective: Vitals:   05/10/20 0505 05/10/20 0720 05/10/20 0721 05/10/20 0800  BP: 134/85     Pulse: 80     Resp: 18     Temp: 97.7 F (36.5 C)  TempSrc:      SpO2: 95% 95% 92% (!) 89%  Weight:      Height:        Intake/Output Summary (Last 24 hours) at 05/10/2020 1357 Last data filed at 05/09/2020 1747 Gross per 24 hour  Intake 472 ml  Output --  Net 472 ml   Filed Weights   05/05/20 1748  Weight: 94.6 kg    Examination:  Constitutional: NAD, overall appears comfortable Eyes: no scleral icterus ENMT: Mucous membranes are moist.  Neck: normal, supple Respiratory: Diminished at the bases but no wheezing, no crackles, slightly increased respiratory  effort Cardiovascular: Regular rate and rhythm, no murmurs / rubs / gallops. No LE edema. Good peripheral pulses Abdomen: non distended, no tenderness. Bowel sounds positive.  Musculoskeletal: no clubbing / cyanosis.  Skin: no rashes Neurologic: Nonfocal   Data Reviewed: I have independently reviewed following labs and imaging studies   CBC: Recent Labs  Lab 05/06/20 0417 05/07/20 0420 05/08/20 0434 05/09/20 0418 05/10/20 0430  WBC 13.4* 16.5* 16.9* 17.7* 18.0*  NEUTROABS 12.1* 15.1* 15.4* 16.3* 16.3*  HGB 14.3 14.6 15.2 15.3 15.4  HCT 45.2 45.7 48.8 48.0 47.8  MCV 97.4 97.6 97.4 97.2 97.4  PLT 175 276 328 329 557   Basic Metabolic Panel: Recent Labs  Lab 05/06/20 0417 05/07/20 0420 05/08/20 0434 05/09/20 0418 05/10/20 0430  NA 134* 139 141 141 142  K 4.7 4.9 4.6 4.3 4.3  CL 99 100 100 101 100  CO2 24 30 31 30  34*  GLUCOSE 136* 175* 173* 175* 196*  BUN 17 23 30* 30* 30*  CREATININE 0.63 0.81 0.89 0.94 0.76  CALCIUM 7.6* 7.9* 7.8* 7.5* 7.8*   GFR: Estimated Creatinine Clearance: 97.8 mL/min (by C-G formula based on SCr of 0.76 mg/dL). Liver Function Tests: Recent Labs  Lab 05/06/20 0417 05/07/20 0420 05/08/20 0434 05/09/20 0418 05/10/20 0430  AST 41 42* 32 31 23  ALT 30 37 33 33 32  ALKPHOS 36* 40 43 47 45  BILITOT 0.7 0.6 0.7 1.0 1.0  PROT 5.9* 6.2* 6.3* 6.0* 6.2*  ALBUMIN 2.5* 2.5* 2.6* 2.5* 2.6*   No results for input(s): LIPASE, AMYLASE in the last 168 hours. No results for input(s): AMMONIA in the last 168 hours. Coagulation Profile: Recent Labs  Lab 05/05/20 1247  INR 1.2   Cardiac Enzymes: No results for input(s): CKTOTAL, CKMB, CKMBINDEX, TROPONINI in the last 168 hours. BNP (last 3 results) No results for input(s): PROBNP in the last 8760 hours. HbA1C: Recent Labs    05/09/20 0418  HGBA1C 6.2*   CBG: Recent Labs  Lab 05/09/20 1111 05/09/20 1658 05/09/20 1931 05/10/20 0723 05/10/20 1115  GLUCAP 309* 240* 315* 155* 331*    Lipid Profile: No results for input(s): CHOL, HDL, LDLCALC, TRIG, CHOLHDL, LDLDIRECT in the last 72 hours. Thyroid Function Tests: No results for input(s): TSH, T4TOTAL, FREET4, T3FREE, THYROIDAB in the last 72 hours. Anemia Panel: Recent Labs    05/09/20 0418 05/10/20 0430  FERRITIN 623* 656*   Urine analysis:    Component Value Date/Time   COLORURINE YELLOW 05/05/2020 1624   APPEARANCEUR CLEAR 05/05/2020 1624   LABSPEC 1.018 05/05/2020 1624   PHURINE 6.0 05/05/2020 1624   GLUCOSEU NEGATIVE 05/05/2020 1624   HGBUR MODERATE (A) 05/05/2020 1624   BILIRUBINUR NEGATIVE 05/05/2020 1624   KETONESUR NEGATIVE 05/05/2020 1624   PROTEINUR 100 (A) 05/05/2020 1624   NITRITE NEGATIVE 05/05/2020 1624   LEUKOCYTESUR NEGATIVE 05/05/2020 1624   Sepsis Labs:  Invalid input(s): PROCALCITONIN, LACTICIDVEN  Recent Results (from the past 240 hour(s))  Respiratory Panel by RT PCR (Flu A&B, Covid) - Nasopharyngeal Swab     Status: Abnormal   Collection Time: 05/05/20 11:56 AM   Specimen: Nasopharyngeal Swab  Result Value Ref Range Status   SARS Coronavirus 2 by RT PCR POSITIVE (A) NEGATIVE Final    Comment: RESULT CALLED TO, READ BACK BY AND VERIFIED WITH: BANO,A. RN @1347  05/05/20 BILLINGSLEY,L (NOTE) SARS-CoV-2 target nucleic acids are DETECTED.  SARS-CoV-2 RNA is generally detectable in upper respiratory specimens  during the acute phase of infection. Positive results are indicative of the presence of the identified virus, but do not rule out bacterial infection or co-infection with other pathogens not detected by the test. Clinical correlation with patient history and other diagnostic information is necessary to determine patient infection status. The expected result is Negative.  Fact Sheet for Patients:  PinkCheek.be  Fact Sheet for Healthcare Providers: GravelBags.it  This test is not yet approved or cleared by the  [place] FDA and  has been authorized for detection and/or diagnosis of SARS-CoV-2 by FDA under an Emergency Use Authorization (EUA).  This EUA will remain in effect (meaning this test can  be used) for the duration of  the COVID-19 declaration under Section 564(b)(1) of the Act, 21 U.S.C. section 360bbb-3(b)(1), unless the authorization is terminated or revoked sooner.      Influenza A by PCR NEGATIVE NEGATIVE Final   Influenza B by PCR NEGATIVE NEGATIVE Final    Comment: (NOTE) The Xpert Xpress SARS-CoV-2/FLU/RSV assay is intended as an aid in  the diagnosis of influenza from Nasopharyngeal swab specimens and  should not be used as a sole basis for treatment. Nasal washings and  aspirates are unacceptable for Xpert Xpress SARS-CoV-2/FLU/RSV  testing.  Fact Sheet for Patients: PinkCheek.be  Fact Sheet for Healthcare Providers: GravelBags.it  This test is not yet approved or cleared by the [place] FDA and  has been authorized for detection and/or diagnosis of SARS-CoV-2 by  FDA under an Emergency Use Authorization (EUA). This EUA will remain  in effect (meaning this test can be used) for the duration of the  Covid-19 declaration under Section 564(b)(1) of the Act, 21  U.S.C. section 360bbb-3(b)(1), unless the authorization is  terminated or revoked. Performed at [facility], [place] [address] [place], [place] [address]   Blood Culture (routine x 2)     Status: None   Collection Time: 05/05/20 12:01 PM   Specimen: BLOOD  Result Value Ref Range Status   Specimen Description   Final    BLOOD BLOOD LEFT HAND Performed at [facility] [address], [place], [place] [address]    Special Requests   Final    BOTTLES DRAWN AEROBIC AND ANAEROBIC Blood Culture adequate volume Performed at [facility] [address], [place], [place] [address]    Culture    Final    NO GROWTH 5 DAYS Performed at [facility], [place] [address], [place], [place] [address]    Report Status 05/10/2020 FINAL  Final  Blood Culture (routine x 2)     Status: None   Collection Time: 05/05/20 12:25 PM   Specimen: BLOOD  Result Value Ref Range Status   Specimen Description   Final    BLOOD RIGHT WRIST Performed at [facility] [address] [place], [place] [address]    Special Requests   Final  BOTTLES DRAWN AEROBIC AND ANAEROBIC Blood Culture adequate volume Performed at [facility] [address] [place], [place] [address]    Culture   Final    NO GROWTH 5 DAYS Performed at [facility], [place] [address], [place], [place] [address]    Report Status 05/10/2020 FINAL  Final  Urine culture     Status: None   Collection Time: 05/05/20  4:24 PM   Specimen: In/Out Cath Urine  Result Value Ref Range Status   Specimen Description   Final    IN/OUT CATH URINE Performed at [facility] [address], [place], [place] [address]    Special Requests   Final    NONE Performed at [facility], [place] [address], [place], [place] [address]    Culture   Final    NO GROWTH Performed at [facility], [place] [address] [place], [place] [address]    Report Status 05/07/2020 FINAL  Final      Radiology Studies: No results found.  Marzetta Board, MD, PhD [facility]  Between 7 am - 7 pm I am available, please contact me via Amion or Securechat  Between 7 pm - 7 am I am not available, please contact night coverage MD/APP via Amion

## 2020-05-11 DIAGNOSIS — U071 COVID-19: Secondary | ICD-10-CM | POA: Diagnosis not present

## 2020-05-11 LAB — CBC
HCT: 49.5 % (ref 39.0–52.0)
Hemoglobin: 15.7 g/dL (ref 13.0–17.0)
MCH: 30.9 pg (ref 26.0–34.0)
MCHC: 31.7 g/dL (ref 30.0–36.0)
MCV: 97.4 fL (ref 80.0–100.0)
Platelets: 313 10*3/uL (ref 150–400)
RBC: 5.08 MIL/uL (ref 4.22–5.81)
RDW: 13.6 % (ref 11.5–15.5)
WBC: 18.2 10*3/uL — ABNORMAL HIGH (ref 4.0–10.5)
nRBC: 0 % (ref 0.0–0.2)

## 2020-05-11 LAB — COMPREHENSIVE METABOLIC PANEL
ALT: 33 U/L (ref 0–44)
AST: 22 U/L (ref 15–41)
Albumin: 2.7 g/dL — ABNORMAL LOW (ref 3.5–5.0)
Alkaline Phosphatase: 43 U/L (ref 38–126)
Anion gap: 9 (ref 5–15)
BUN: 31 mg/dL — ABNORMAL HIGH (ref 8–23)
CO2: 31 mmol/L (ref 22–32)
Calcium: 7.5 mg/dL — ABNORMAL LOW (ref 8.9–10.3)
Chloride: 99 mmol/L (ref 98–111)
Creatinine, Ser: 0.77 mg/dL (ref 0.61–1.24)
GFR, Estimated: >60 mL/min (ref >60)
Glucose, Bld: 174 mg/dL — ABNORMAL HIGH (ref 70–99)
Potassium: 4.3 mmol/L (ref 3.5–5.1)
Sodium: 139 mmol/L (ref 135–145)
Total Bilirubin: 1 mg/dL (ref 0.3–1.2)
Total Protein: 5.9 g/dL — ABNORMAL LOW (ref 6.5–8.1)

## 2020-05-11 LAB — D-DIMER, QUANTITATIVE: D-Dimer, Quant: 0.56 ug/mL-FEU — ABNORMAL HIGH (ref 0.00–0.50)

## 2020-05-11 LAB — GLUCOSE, CAPILLARY
Glucose-Capillary: 159 mg/dL — ABNORMAL HIGH (ref 70–99)
Glucose-Capillary: 319 mg/dL — ABNORMAL HIGH (ref 70–99)
Glucose-Capillary: 247 mg/dL — ABNORMAL HIGH (ref 70–99)
Glucose-Capillary: 246 mg/dL — ABNORMAL HIGH (ref 70–99)

## 2020-05-11 LAB — C-REACTIVE PROTEIN: CRP: 0.9 mg/dL (ref <1.0)

## 2020-05-11 MED ORDER — METHYLPREDNISOLONE SODIUM SUCC 40 MG IJ SOLR
40.0000 mg | Freq: Two times a day (BID) | INTRAMUSCULAR | Status: DC
  Administered 2020-05-11 – 2020-05-12 (×2): 40 mg via INTRAVENOUS
  Filled 2020-05-11 (×2): qty 1

## 2020-05-11 NOTE — Progress Notes (Signed)
Occupational Therapy Treatment Patient Details Name: Kyle Flowers MRN: [id number] DOB: Nov 02, 1950 Today's Date: 05/11/2020    History of present illness Patient is a 69 year old male PMH of HTN, HLD, prostate cancer status post radical prostatectomy. Patient presented to the ED on 05/05/2020 with 1 week history of progressively worsening shortness of breath, cough ,congestion. Patient tested positive COVID in ED.    OT comments  Patient demonstrating improved independence with ADLs but continues to have decreased activity tolerance and requiring 2 L [place]. Patient reports possible DC tomorrow. Therapist instructed patient to continue use of breathing devices and to purchase a pulse oximeter for home use. Patient verbalized understanding.   Follow Up Recommendations  No OT follow up    Equipment Recommendations  None recommended by OT    Recommendations for Other Services      Precautions / Restrictions Precautions Precautions: Fall Precaution Comments: monitor O2       Mobility Bed Mobility               General bed mobility comments: oob in recliner  Transfers   Equipment used: None Transfers: Sit to/from Stand Sit to Stand: Supervision Stand pivot transfers: Supervision       General transfer comment: supervision to ambulate in hall 400 feet. patient holding onto VS machine.    Balance Overall balance assessment: No apparent balance deficits (not formally assessed)                                         ADL either performed or assessed with clinical judgement   ADL                       Lower Body Dressing: Supervision/safety Lower Body Dressing Details (indicate cue type and reason): able to manage socks from seated position. Toilet Transfer: Copy Details (indicate cue type and reason): ambulated to bathroom and stood over sink with supervision. Toileting- Clothing Manipulation and Hygiene:  Supervision/safety Toileting - Clothing Manipulation Details (indicate cue type and reason): supervision for toileting - able to manage clothing and wiping             Vision Patient Visual Report: No change from baseline     Perception     Praxis      Cognition Arousal/Alertness: Awake/alert Behavior During Therapy: WFL for tasks assessed/performed Overall Cognitive Status: Within Functional Limits for tasks assessed                                          Exercises     Shoulder Instructions       General Comments      Pertinent Vitals/ Pain       Pain Assessment: No/denies pain  Home Living                                          Prior Functioning/Environment              Frequency  Min 3X/week        Progress Toward Goals  OT Goals(current goals can now be found in the care plan section)  Progress towards OT goals:  Progressing toward goals  Acute Rehab OT Goals Patient Stated Goal: regain energy Time For Goal Achievement: 05/22/20 Potential to Achieve Goals: Good  Plan Discharge plan remains appropriate    Co-evaluation                 AM-PAC OT "6 Clicks" Daily Activity     Outcome Measure   Help from another person eating meals?: None Help from another person taking care of personal grooming?: None Help from another person toileting, which includes using toliet, bedpan, or urinal?: None Help from another person bathing (including washing, rinsing, drying)?: None Help from another person to put on and taking off regular upper body clothing?: None Help from another person to put on and taking off regular lower body clothing?: None 6 Click Score: 24    End of Session Equipment Utilized During Treatment: Oxygen  OT Visit Diagnosis: Other abnormalities of gait and mobility (R26.89);Muscle weakness (generalized) (M62.81)   Activity Tolerance Patient tolerated treatment well   Patient Left in  chair;with call bell/phone within reach   Nurse Communication Mobility status (o2 sats)        Time: 9485-4627 OT Time Calculation (min): 15 min  Charges: OT General Charges $OT Visit: 1 Visit OT Treatments $Therapeutic Activity: 8-22 mins  Yasmine Kilbourne, OTR/L [place]  Office [phone] Pager: [place] 05/11/2020, 4:10 PM

## 2020-05-11 NOTE — Progress Notes (Signed)
PROGRESS NOTE  Kyle Flowers:[id number] DOB: 09/15/1950 DOA: 05/05/2020 PCP: Hinton Lovely, MD   LOS: 6 days   Brief Narrative / Interim history: 69 year old male with HTN, HLD, prostate cancer status post radical prostatectomy, very hard of hearing, came into the hospital and was admitted on 10/8 with 1 week history of shortness of breath, cough, chest congestion.  In the ED he was febrile, tachycardic, tachypneic, COVID-19 was positive and a chest x-ray showed multifocal pneumonia.  He was also hypoxic requiring supplemental oxygen.  Subjective / 24h Interval events: Reports that he is feeling well this morning, he walked to the bathroom and back in 4 L any significant shortness of breath.  Assessment & Plan:  Principal Problem Acute Hypoxic Respiratory Failure due to Covid-19 Viral Illness -Was on 15 L at 1 point, appears to be on 4 L today, gradually improving -Completed 5 days of remdesivir as well as empiric antibiotics as below -Continue steroids, plan for total of 10 days -On 4 L this morning still, but doing well on ambulation.  Will attempt to see if we can wean off further oxygen needs  COVID-19 Labs  Recent Labs    05/09/20 0418 05/10/20 0430 05/11/20 0433  DDIMER 0.70* 0.64* 0.56*  FERRITIN 623* 656*  --   CRP 4.0* 1.9* 0.9    Lab Results  Component Value Date   SARSCOV2NAA POSITIVE (A) 05/05/2020   [place] NEGATIVE 02/16/2019    Active Problems Prediabetes with hyperglycemia -Hemoglobin A1c 6.2.  Patient was started on Lantus as well as sliding scale, he is hyperglycemic due to steroids. -CBGs still vary wildly, decrease steroid dose today  CBG (last 3)  Recent Labs    05/10/20 2105 05/11/20 0732 05/11/20 1120  GLUCAP 248* 159* 319*    Possible secondary bacterial pneumonia -Completed 5 days of ceftriaxone azithromycin  Essential hypertension -Blood pressure stable on lisinopril, Lasix, continue  Hyperlipidemia -Continue  statin  History of prostate cancer -Status post radical prostatectomy  Scheduled Meds: . vitamin C  500 mg Oral Daily  . aspirin EC  81 mg Oral QHS  . enoxaparin (LOVENOX) injection  40 mg Subcutaneous Q24H  . furosemide  40 mg Intravenous Daily  . insulin aspart  0-5 Units Subcutaneous QHS  . insulin aspart  0-9 Units Subcutaneous TID WC  . insulin aspart  3 Units Subcutaneous TID WC  . insulin glargine  15 Units Subcutaneous QHS  . lisinopril  20 mg Oral QPM  . methylPREDNISolone (SOLU-MEDROL) injection  60 mg Intravenous Q8H  . pravastatin  40 mg Oral q1800  . zinc sulfate  220 mg Oral Daily   Continuous Infusions: PRN Meds:.acetaminophen, albuterol, chlorpheniramine-HYDROcodone, guaiFENesin-dextromethorphan, ondansetron **OR** ondansetron (ZOFRAN) IV  DVT prophylaxis: Lovenox Code Status: Full code Family Communication: daughter Fara Chute [phone]  Status is: Inpatient  Remains inpatient appropriate because:Inpatient level of care appropriate due to severity of illness  Dispo: The patient is from: Home              Anticipated d/c is to: Home              Anticipated d/c date is: 2 days              Patient currently is not medically stable to d/c.  Consultants:  None   Procedures:  None   Microbiology: None   Antibacterials: Ceftriaxone / Azithromycin 10/8 - 10/12  Objective: Vitals:   05/10/20 1500 05/10/20 1800 05/10/20 2020 05/11/20 0435  BP:   129/74 129/84  Pulse:   95 82  Resp:   20 18  Temp:   98.1 F (36.7 C) 98.1 F (36.7 C)  TempSrc:   Oral   SpO2: 95% 95% 93% 94%  Weight:      Height:        Intake/Output Summary (Last 24 hours) at 05/11/2020 1318 Last data filed at 05/11/2020 1252 Gross per 24 hour  Intake 826 ml  Output --  Net 826 ml   Filed Weights   05/05/20 1748  Weight: 94.6 kg    Examination:  Constitutional: No distress, comfortable Eyes: No scleral icterus ENMT: Moist mucous membranes Neck: normal,  supple Respiratory: Diminished at the bases, no wheezing, no crackles, tachypneic at times Cardiovascular: Rate and rhythm, no murmurs, no peripheral edema Abdomen: Nondistended, bowel sounds positive Musculoskeletal: no clubbing / cyanosis.  Skin: No rashes appreciated Neurologic: Nonfocal, equal strength   Data Reviewed: I have independently reviewed following labs and imaging studies   CBC: Recent Labs  Lab 05/06/20 0417 05/06/20 0417 05/07/20 0420 05/08/20 0434 05/09/20 0418 05/10/20 0430 05/11/20 0433  WBC 13.4*   < > 16.5* 16.9* 17.7* 18.0* 18.2*  NEUTROABS 12.1*  --  15.1* 15.4* 16.3* 16.3*  --   HGB 14.3   < > 14.6 15.2 15.3 15.4 15.7  HCT 45.2   < > 45.7 48.8 48.0 47.8 49.5  MCV 97.4   < > 97.6 97.4 97.2 97.4 97.4  PLT 175   < > 276 328 329 312 313   < > = values in this interval not displayed.   Basic Metabolic Panel: Recent Labs  Lab 05/07/20 0420 05/08/20 0434 05/09/20 0418 05/10/20 0430 05/11/20 0433  NA 139 141 141 142 139  K 4.9 4.6 4.3 4.3 4.3  CL 100 100 101 100 99  CO2 30 31 30  34* 31  GLUCOSE 175* 173* 175* 196* 174*  BUN 23 30* 30* 30* 31*  CREATININE 0.81 0.89 0.94 0.76 0.77  CALCIUM 7.9* 7.8* 7.5* 7.8* 7.5*   GFR: Estimated Creatinine Clearance: 97.8 mL/min (by C-G formula based on SCr of 0.77 mg/dL). Liver Function Tests: Recent Labs  Lab 05/07/20 0420 05/08/20 0434 05/09/20 0418 05/10/20 0430 05/11/20 0433  AST 42* 32 31 23 22   ALT 37 33 33 32 33  ALKPHOS 40 43 47 45 43  BILITOT 0.6 0.7 1.0 1.0 1.0  PROT 6.2* 6.3* 6.0* 6.2* 5.9*  ALBUMIN 2.5* 2.6* 2.5* 2.6* 2.7*   No results for input(s): LIPASE, AMYLASE in the last 168 hours. No results for input(s): AMMONIA in the last 168 hours. Coagulation Profile: Recent Labs  Lab 05/05/20 1247  INR 1.2   Cardiac Enzymes: No results for input(s): CKTOTAL, CKMB, CKMBINDEX, TROPONINI in the last 168 hours. BNP (last 3 results) No results for input(s): PROBNP in the last 8760  hours. HbA1C: Recent Labs    05/09/20 0418  HGBA1C 6.2*   CBG: Recent Labs  Lab 05/10/20 1115 05/10/20 1622 05/10/20 2105 05/11/20 0732 05/11/20 1120  GLUCAP 331* 271* 248* 159* 319*   Lipid Profile: No results for input(s): CHOL, HDL, LDLCALC, TRIG, CHOLHDL, LDLDIRECT in the last 72 hours. Thyroid Function Tests: No results for input(s): TSH, T4TOTAL, FREET4, T3FREE, THYROIDAB in the last 72 hours. Anemia Panel: Recent Labs    05/09/20 0418 05/10/20 0430  FERRITIN 623* 656*   Urine analysis:    Component Value Date/Time   COLORURINE YELLOW 05/05/2020 [place] 05/05/2020  1624   LABSPEC 1.018 05/05/2020 1624   PHURINE 6.0 05/05/2020 1624   GLUCOSEU NEGATIVE 05/05/2020 1624   HGBUR MODERATE (A) 05/05/2020 1624   BILIRUBINUR NEGATIVE 05/05/2020 [place] 05/05/2020 1624   PROTEINUR 100 (A) 05/05/2020 1624   NITRITE NEGATIVE 05/05/2020 1624   LEUKOCYTESUR NEGATIVE 05/05/2020 1624   Sepsis Labs: Invalid input(s): PROCALCITONIN, LACTICIDVEN  Recent Results (from the past 240 hour(s))  Respiratory Panel by RT PCR (Flu A&B, Covid) - Nasopharyngeal Swab     Status: Abnormal   Collection Time: 05/05/20 11:56 AM   Specimen: Nasopharyngeal Swab  Result Value Ref Range Status   SARS Coronavirus 2 by RT PCR POSITIVE (A) NEGATIVE Final    Comment: RESULT CALLED TO, READ BACK BY AND VERIFIED WITH: BANO,A. RN @1347  05/05/20 BILLINGSLEY,L (NOTE) SARS-CoV-2 target nucleic acids are DETECTED.  SARS-CoV-2 RNA is generally detectable in upper respiratory specimens  during the acute phase of infection. Positive results are indicative of the presence of the identified virus, but do not rule out bacterial infection or co-infection with other pathogens not detected by the test. Clinical correlation with patient history and other diagnostic information is necessary to determine patient infection status. The expected result is Negative.  Fact Sheet  for Patients:  PinkCheek.be  Fact Sheet for Healthcare Providers: GravelBags.it  This test is not yet approved or cleared by the [place] FDA and  has been authorized for detection and/or diagnosis of SARS-CoV-2 by FDA under an Emergency Use Authorization (EUA).  This EUA will remain in effect (meaning this test can  be used) for the duration of  the COVID-19 declaration under Section 564(b)(1) of the Act, 21 U.S.C. section 360bbb-3(b)(1), unless the authorization is terminated or revoked sooner.      Influenza A by PCR NEGATIVE NEGATIVE Final   Influenza B by PCR NEGATIVE NEGATIVE Final    Comment: (NOTE) The Xpert Xpress SARS-CoV-2/FLU/RSV assay is intended as an aid in  the diagnosis of influenza from Nasopharyngeal swab specimens and  should not be used as a sole basis for treatment. Nasal washings and  aspirates are unacceptable for Xpert Xpress SARS-CoV-2/FLU/RSV  testing.  Fact Sheet for Patients: PinkCheek.be  Fact Sheet for Healthcare Providers: GravelBags.it  This test is not yet approved or cleared by the [place] FDA and  has been authorized for detection and/or diagnosis of SARS-CoV-2 by  FDA under an Emergency Use Authorization (EUA). This EUA will remain  in effect (meaning this test can be used) for the duration of the  Covid-19 declaration under Section 564(b)(1) of the Act, 21  U.S.C. section 360bbb-3(b)(1), unless the authorization is  terminated or revoked. Performed at [facility], [place] [address], [place], [place] [address]   Blood Culture (routine x 2)     Status: None   Collection Time: 05/05/20 12:01 PM   Specimen: BLOOD  Result Value Ref Range Status   Specimen Description   Final    BLOOD BLOOD LEFT HAND Performed at [facility] [address], [place], [place] [address]     Special Requests   Final    BOTTLES DRAWN AEROBIC AND ANAEROBIC Blood Culture adequate volume Performed at [facility] [address], [place], [place] [address]    Culture   Final    NO GROWTH 5 DAYS Performed at [facility], [place] [address] [place], [place] [address]    Report Status 05/10/2020 FINAL  Final  Blood  Culture (routine x 2)     Status: None   Collection Time: 05/05/20 12:25 PM   Specimen: BLOOD  Result Value Ref Range Status   Specimen Description   Final    BLOOD RIGHT WRIST Performed at [facility] [address] [place], [place] [address]    Special Requests   Final    BOTTLES DRAWN AEROBIC AND ANAEROBIC Blood Culture adequate volume Performed at [facility] [address] [place], [place] [address]    Culture   Final    NO GROWTH 5 DAYS Performed at [facility], [place] [address] [place], [place] [address]    Report Status 05/10/2020 FINAL  Final  Urine culture     Status: None   Collection Time: 05/05/20  4:24 PM   Specimen: In/Out Cath Urine  Result Value Ref Range Status   Specimen Description   Final    IN/OUT CATH URINE Performed at [facility] [address], [place], [place] [address]    Special Requests   Final    NONE Performed at [facility], [place] [address] [place], [place] [address]    Culture   Final    NO GROWTH Performed at [facility], [place] [address] [place], [place] [address]    Report Status 05/07/2020 FINAL  Final      Radiology Studies: No results found.  Marzetta Board, MD, PhD [facility]  Between 7 am - 7 pm I am available, please contact me via Amion or Securechat  Between 7 pm - 7 am I am not available, please contact night coverage MD/APP via Amion

## 2020-05-11 NOTE — Progress Notes (Signed)
SATURATION QUALIFICATIONS: (This note is used to comply with regulatory documentation for home oxygen)  Patient Saturations on Room Air at Rest = 88%  Patient Saturations on Room Air while Ambulating = 83%  Patient Saturations on 3 Liters of oxygen while Ambulating = 91%  Please briefly explain why patient needs home oxygen: Patient will need home oxygen to maintain a therapeutic oxygen level

## 2020-05-11 NOTE — Progress Notes (Signed)
SATURATION QUALIFICATIONS: (This note is used to comply with regulatory documentation for home oxygen)  Patient Saturations on Room Air at Rest = 87%  Patient Saturations on Room Air while Ambulating = NA  Patient Saturations on 2 Liters of oxygen while Ambulating = 87%  Please briefly explain why patient needs home oxygen:See above

## 2020-05-12 DIAGNOSIS — R0902 Hypoxemia: Secondary | ICD-10-CM

## 2020-05-12 DIAGNOSIS — U071 COVID-19: Secondary | ICD-10-CM | POA: Diagnosis not present

## 2020-05-12 LAB — GLUCOSE, CAPILLARY
Glucose-Capillary: 165 mg/dL — ABNORMAL HIGH (ref 70–99)
Glucose-Capillary: 293 mg/dL — ABNORMAL HIGH (ref 70–99)

## 2020-05-12 LAB — CREATININE, SERUM
Creatinine, Ser: 0.75 mg/dL (ref 0.61–1.24)
GFR, Estimated: >60 mL/min (ref >60)

## 2020-05-12 MED ORDER — DEXAMETHASONE 6 MG PO TABS: 6.0000 mg | ORAL_TABLET | Freq: Two times a day (BID) | ORAL | 0 refills | Status: DC

## 2020-05-12 MED ORDER — DEXAMETHASONE 6 MG PO TABS: 6.0000 mg | ORAL_TABLET | Freq: Every day | ORAL | 0 refills | Status: AC

## 2020-05-12 MED ORDER — DEXAMETHASONE 6 MG PO TABS: 6.0000 mg | ORAL_TABLET | Freq: Every day | ORAL | 0 refills | Status: DC

## 2020-05-12 NOTE — Discharge Summary (Signed)
Physician Discharge Summary  Kyle Flowers TFT:[id number] DOB: [birth year] DOA: 05/05/2020  PCP: Hinton Lovely, MD  Admit date: 05/05/2020 Discharge date: 05/12/2020  Admitted From: home Disposition:  home  Recommendations for Outpatient Follow-up:  1. Follow up with PCP in 1-2 weeks  Home Health: PT Equipment/Devices: home O2  Discharge Condition: stable CODE STATUS: Full code Diet recommendation: heart healthy, diabetic   HPI: Per admitting MD, Kyle Flowers is a 69 y.o. male with medical history significant of HTN, HLD. Presenting with 1 week of shortness of breath. He states about a week ago he noticed some cough and congestion in his chest. He took some nyquil, which helped a little but didn't completely resolve his issues. The symptoms progressed and he decided to see his PCP. He was given antibiotics and steroids for bronchitis and sinus congestion. This did not resolve all his symptoms. He became increasingly concerned and went for COVID testing today. That POC testing came back negative. So, it was recommended he come to the ED for a workup. However, in the ED he was found to be COVID positive. He denies any other aggravating or alleviating factors. He denies any other treatments.   Hospital Course / Discharge diagnoses: Principal Problem Acute Hypoxic Respiratory Failure due to Covid-19 Viral Illness-patient admitted to the hospital with COVID-19 pneumonia and hypoxic respiratory failure.  He was started on remdesivir and completed 5 days while hospitalized, and due to concern for bacterial pneumonia also completed 5 days of antibiotics.  He was hypoxic up to 15 L, but with treatment he improved, was weaned off to 3 L, able to maintain good oxygen saturations on ambulation, clinically improved, feels better, stronger, and will be discharged home in stable condition with supplemental oxygen and will finish Decadron as an outpatient.  Active Problems Prediabetes with  steroid-induced hyperglycemia -Hemoglobin A1c 6.2.  Anticipate sugars will normalize once he finishes steroids Possible secondary bacterial pneumonia -Completed 5 days of ceftriaxone azithromycin Essential hypertension -resume home medications Hyperlipidemia-Continue statin History of prostate cancer-Status post radical prostatectomy  Discharge Instructions   Allergies as of 05/12/2020   No Known Allergies     Medication List    STOP taking these medications   cefdinir 300 MG capsule Commonly known as: OMNICEF   predniSONE 20 MG tablet Commonly known as: DELTASONE     TAKE these medications   acetaminophen 500 MG tablet Commonly known as: TYLENOL Take 1,000 mg by mouth every 6 (six) hours as needed for mild pain.   albuterol 108 (90 Base) MCG/ACT inhaler Commonly known as: VENTOLIN HFA Inhale 2 puffs into the lungs every 4 (four) hours as needed for shortness of breath.   aspirin EC 81 MG tablet Take 81 mg by mouth at bedtime.   dexamethasone 6 MG tablet Commonly known as: DECADRON Take 1 tablet (6 mg total) by mouth daily for 3 days.   ergocalciferol 1.25 MG (50000 UT) capsule Commonly known as: VITAMIN D2 Take 50,000 Units by mouth once a week.   lisinopril 20 MG tablet Commonly known as: ZESTRIL Take 20 mg by mouth every evening.   lovastatin 40 MG tablet Commonly known as: MEVACOR Take 40 mg by mouth at bedtime.   PROBIOTIC DAILY PO Take 1 capsule by mouth daily.            Durable Medical Equipment  (From admission, onward)         Start     Ordered   05/12/20 [phone]  For home use only DME oxygen  Once       Question Answer Comment  Length of Need 6 Months   Mode or (Route) Nasal cannula   Liters per Minute 3   Frequency Continuous (stationary and portable oxygen unit needed)   Oxygen delivery system Gas      05/12/20 0640           Consultations:  None   Procedures/Studies:  DG Chest 2 View  Result Date: 05/05/2020 CLINICAL  DATA:  Cough and shortness of breath. Decreased oxygen saturation EXAM: CHEST - 2 VIEW COMPARISON:  May 03, 2020 FINDINGS: Shallow degree of inspiration. Increase in airspace consolidation in the right mid and lower lung regions. Atelectatic change left base. Heart is upper normal in size with pulmonary vascularity normal. No adenopathy. There is multifocal arthropathy in the lumbar spine. IMPRESSION: Shallow degree of inspiration. Airspace opacity consistent with multifocal pneumonia on the right in the mid and lower lung regions with increase in consolidation compared to 2 days prior. Left base atelectasis. Stable cardiac silhouette. Check of COVID-19 status may be advisable in this circumstance. Electronically Signed   By: Lowella Grip III M.D.   On: 05/05/2020 12:35     Subjective: Feeling good, no chest pain, no shortness of breath  Discharge Exam: BP 138/86 (BP Location: Right Arm)   Pulse 83   Temp 97.8 F (36.6 C) (Oral)   Resp 16   Ht 5\' 10"  (1.778 m)   Wt 94.6 kg   SpO2 95% Comment: pt needed time to recover from ambulating to bathroom  BMI 29.92 kg/m   General: Pt is alert, awake, not in acute distress Cardiovascular: RRR, S1/S2 +, no rubs, no gallops Respiratory: CTA bilaterally, no wheezing, no rhonchi Abdominal: Soft, NT, ND, bowel sounds + Extremities: no edema, no cyanosis    The results of significant diagnostics from this hospitalization (including imaging, microbiology, ancillary and laboratory) are listed below for reference.     Microbiology: Recent Results (from the past 240 hour(s))  Respiratory Panel by RT PCR (Flu A&B, Covid) - Nasopharyngeal Swab     Status: Abnormal   Collection Time: 05/05/20 11:56 AM   Specimen: Nasopharyngeal Swab  Result Value Ref Range Status   SARS Coronavirus 2 by RT PCR POSITIVE (A) NEGATIVE Final    Comment: RESULT CALLED TO, READ BACK BY AND VERIFIED WITH: BANO,A. RN @1347  05/05/20 BILLINGSLEY,L (NOTE) SARS-CoV-2  target nucleic acids are DETECTED.  SARS-CoV-2 RNA is generally detectable in upper respiratory specimens  during the acute phase of infection. Positive results are indicative of the presence of the identified virus, but do not rule out bacterial infection or co-infection with other pathogens not detected by the test. Clinical correlation with patient history and other diagnostic information is necessary to determine patient infection status. The expected result is Negative.  Fact Sheet for Patients:  PinkCheek.be  Fact Sheet for Healthcare Providers: GravelBags.it  This test is not yet approved or cleared by the [place] FDA and  has been authorized for detection and/or diagnosis of SARS-CoV-2 by FDA under an Emergency Use Authorization (EUA).  This EUA will remain in effect (meaning this test can  be used) for the duration of  the COVID-19 declaration under Section 564(b)(1) of the Act, 21 U.S.C. section 360bbb-3(b)(1), unless the authorization is terminated or revoked sooner.      Influenza A by PCR NEGATIVE NEGATIVE Final   Influenza B by PCR NEGATIVE NEGATIVE Final  Comment: (NOTE) The Xpert Xpress SARS-CoV-2/FLU/RSV assay is intended as an aid in  the diagnosis of influenza from Nasopharyngeal swab specimens and  should not be used as a sole basis for treatment. Nasal washings and  aspirates are unacceptable for Xpert Xpress SARS-CoV-2/FLU/RSV  testing.  Fact Sheet for Patients: PinkCheek.be  Fact Sheet for Healthcare Providers: GravelBags.it  This test is not yet approved or cleared by the [place] FDA and  has been authorized for detection and/or diagnosis of SARS-CoV-2 by  FDA under an Emergency Use Authorization (EUA). This EUA will remain  in effect (meaning this test can be used) for the duration of the  Covid-19 declaration under  Section 564(b)(1) of the Act, 21  U.S.C. section 360bbb-3(b)(1), unless the authorization is  terminated or revoked. Performed at [facility], [place] [address] [place], [place] [address]   Blood Culture (routine x 2)     Status: None   Collection Time: 05/05/20 12:01 PM   Specimen: BLOOD  Result Value Ref Range Status   Specimen Description   Final    BLOOD BLOOD LEFT HAND Performed at [facility] [address], [place], [place] [address]    Special Requests   Final    BOTTLES DRAWN AEROBIC AND ANAEROBIC Blood Culture adequate volume Performed at [facility] [address], [place], [place] [address]    Culture   Final    NO GROWTH 5 DAYS Performed at [facility], [place] [address], [place], [place] [address]    Report Status 05/10/2020 FINAL  Final  Blood Culture (routine x 2)     Status: None   Collection Time: 05/05/20 12:25 PM   Specimen: BLOOD  Result Value Ref Range Status   Specimen Description   Final    BLOOD RIGHT WRIST Performed at [facility] [address] [place], [place] [address]    Special Requests   Final    BOTTLES DRAWN AEROBIC AND ANAEROBIC Blood Culture adequate volume Performed at [facility] [address] [place], [place] [address]    Culture   Final    NO GROWTH 5 DAYS Performed at [facility], [place] [address] [place], [place] [address]    Report Status 05/10/2020 FINAL  Final  Urine culture     Status: None   Collection Time: 05/05/20  4:24 PM   Specimen: In/Out Cath Urine  Result Value Ref Range Status   Specimen Description   Final    IN/OUT CATH URINE Performed at [facility] [address], [place], [place] [address]    Special Requests   Final    NONE Performed at [facility], [place] [address], [place], [place] [address]    Culture   Final    NO GROWTH Performed at [facility], [place] [address], [place], [place] [address]    Report Status 05/07/2020 FINAL  Final     Labs: Basic Metabolic Panel: Recent Labs  Lab 05/07/20 0420 05/07/20 0420 05/08/20 0434 05/09/20 0418 05/10/20 0430 05/11/20 0433 05/12/20 0438  NA 139  --  141 141 142 139  --   K 4.9  --  4.6 4.3 4.3 4.3  --   CL 100  --  100 101 100 99  --   CO2 30  --  31 30 34* 31  --   GLUCOSE 175*  --  173* 175* 196*  174*  --   BUN 23  --  30* 30* 30* 31*  --   CREATININE 0.81   < > 0.89 0.94 0.76 0.77 0.75  CALCIUM 7.9*  --  7.8* 7.5* 7.8* 7.5*  --    < > = values in this interval not displayed.   Liver Function Tests: Recent Labs  Lab 05/07/20 0420 05/08/20 0434 05/09/20 0418 05/10/20 0430 05/11/20 0433  AST 42* 32 31 23 22   ALT 37 33 33 32 33  ALKPHOS 40 43 47 45 43  BILITOT 0.6 0.7 1.0 1.0 1.0  PROT 6.2* 6.3* 6.0* 6.2* 5.9*  ALBUMIN 2.5* 2.6* 2.5* 2.6* 2.7*   CBC: Recent Labs  Lab 05/06/20 0417 05/06/20 0417 05/07/20 0420 05/08/20 0434 05/09/20 0418 05/10/20 0430 05/11/20 0433  WBC 13.4*   < > 16.5* 16.9* 17.7* 18.0* 18.2*  NEUTROABS 12.1*  --  15.1* 15.4* 16.3* 16.3*  --   HGB 14.3   < > 14.6 15.2 15.3 15.4 15.7  HCT 45.2   < > 45.7 48.8 48.0 47.8 49.5  MCV 97.4   < > 97.6 97.4 97.2 97.4 97.4  PLT 175   < > 276 328 329 312 313   < > = values in this interval not displayed.   CBG: Recent Labs  Lab 05/11/20 0732 05/11/20 1120 05/11/20 1619 05/11/20 2058 05/12/20 0725  GLUCAP 159* 319* 247* 246* 165*   Hgb A1c No results for input(s): HGBA1C in the last 72 hours. Lipid Profile No results for input(s): CHOL, HDL, LDLCALC, TRIG, CHOLHDL, LDLDIRECT in the last 72 hours. Thyroid function studies No results for input(s): TSH, T4TOTAL, T3FREE, THYROIDAB in the last 72 hours.  Invalid input(s): FREET3 Urinalysis    Component Value Date/Time   COLORURINE YELLOW 05/05/2020 [place] 05/05/2020 1624   LABSPEC 1.018 05/05/2020 1624   PHURINE  6.0 05/05/2020 1624   GLUCOSEU NEGATIVE 05/05/2020 1624   HGBUR MODERATE (A) 05/05/2020 1624   BILIRUBINUR NEGATIVE 05/05/2020 1624   KETONESUR NEGATIVE 05/05/2020 1624   PROTEINUR 100 (A) 05/05/2020 1624   NITRITE NEGATIVE 05/05/2020 1624   LEUKOCYTESUR NEGATIVE 05/05/2020 1624    FURTHER DISCHARGE INSTRUCTIONS:   Get Medicines reviewed and adjusted: Please take all your medications with you for your next visit with your Primary MD   Laboratory/radiological data: Please request your Primary MD to go over all hospital tests and procedure/radiological results at the follow up, please ask your Primary MD to get all Hospital records sent to his/her office.   In some cases, they will be blood work, cultures and biopsy results pending at the time of your discharge. Please request that your primary care M.D. goes through all the records of your hospital data and follows up on these results.   Also Note the following: If you experience worsening of your admission symptoms, develop shortness of breath, life threatening emergency, suicidal or homicidal thoughts you must seek medical attention immediately by calling 911 or calling your MD immediately  if symptoms less severe.   You must read complete instructions/literature along with all the possible adverse reactions/side effects for all the Medicines you take and that have been prescribed to you. Take any new Medicines after you have completely understood and accpet all the possible adverse reactions/side effects.    Do not drive when taking Pain medications or sleeping medications (Benzodaizepines)   Do not take more than prescribed Pain, Sleep and Anxiety Medications. It is not advisable to combine anxiety,sleep  and pain medications without talking with your primary care practitioner   Special Instructions: If you have smoked or chewed Tobacco  in the last 2 yrs please stop smoking, stop any regular Alcohol  and or any Recreational drug  use.   Wear Seat belts while driving.   Please note: You were cared for by a hospitalist during your hospital stay. Once you are discharged, your primary care physician will handle any further medical issues. Please note that NO REFILLS for any discharge medications will be authorized once you are discharged, as it is imperative that you return to your primary care physician (or establish a relationship with a primary care physician if you do not have one) for your post hospital discharge needs so that they can reassess your need for medications and monitor your lab values.  Time coordinating discharge: 35 minutes  SIGNED:  Marzetta Board, MD, PhD 05/12/2020, 9:46 AM

## 2020-05-12 NOTE — Plan of Care (Signed)

## 2020-05-12 NOTE — TOC Transition Note (Signed)
Transition of Care [facility]) - CM/SW Discharge Note   Patient Details  Name: Kyle Flowers MRN: [id number] Date of Birth: 15-Apr-1951  [facility] CM/SW Contact:  Trish Mage, LCSW Phone Number: 05/12/2020, 8:55 AM   Clinical Narrative:   Patient who is discharging today is in need of home O2, has a ride.  SAT note and order seen and appreciated.  Contacted Caryl Pina with [employer] who will arrange for delivery of travel cannister, home unit to home. Family member will bring travel unit when they come to pick up patient. PT/OT are recommending no HH services.  No further needs identified. TOC sign off.    Final next level of care: Home/Self Care Barriers to Discharge: No Barriers Identified   Patient Goals and CMS Choice        Discharge Placement                       Discharge Plan and Services                                     Social Determinants of Health (SDOH) Interventions     Readmission Risk Interventions No flowsheet data found.

## 2020-05-12 NOTE — Discharge Instructions (Signed)
COVID-19 COVID-19 is a respiratory infection that is caused by a virus called severe acute respiratory syndrome coronavirus 2 (SARS-CoV-2). The disease is also known as coronavirus disease or novel coronavirus. In some people, the virus may not cause any symptoms. In others, it may cause a serious infection. The infection can get worse quickly and can lead to complications, such as:  Pneumonia, or infection of the lungs.  Acute respiratory distress syndrome or ARDS. This is a condition in which fluid build-up in the lungs prevents the lungs from filling with air and passing oxygen into the blood.  Acute respiratory failure. This is a condition in which there is not enough oxygen passing from the lungs to the body or when carbon dioxide is not passing from the lungs out of the body.  Sepsis or septic shock. This is a serious bodily reaction to an infection.  Blood clotting problems.  Secondary infections due to bacteria or fungus.  Organ failure. This is when your body's organs stop working. The virus that causes COVID-19 is contagious. This means that it can spread from person to person through droplets from coughs and sneezes (respiratory secretions). What are the causes? This illness is caused by a virus. You may catch the virus by:  Breathing in droplets from an infected person. Droplets can be spread by a person breathing, speaking, singing, coughing, or sneezing.  Touching something, like a table or a doorknob, that was exposed to the virus (contaminated) and then touching your mouth, nose, or eyes. What increases the risk? Risk for infection You are more likely to be infected with this virus if you:  Are within 6 feet (2 meters) of a person with COVID-19.  Provide care for or live with a person who is infected with COVID-19.  Spend time in crowded indoor spaces or live in shared housing. Risk for serious illness You are more likely to become seriously ill from the virus if  you:  Are 51 years of age or older. The higher your age, the more you are at risk for serious illness.  Live in a nursing home or long-term care facility.  Have cancer.  Have a long-term (chronic) disease such as: ? Chronic lung disease, including chronic obstructive pulmonary disease or asthma. ? A long-term disease that lowers your body's ability to fight infection (immunocompromised). ? Heart disease, including heart failure, a condition in which the arteries that lead to the heart become narrow or blocked (coronary artery disease), a disease which makes the heart muscle thick, weak, or stiff (cardiomyopathy). ? Diabetes. ? Chronic kidney disease. ? Sickle cell disease, a condition in which red blood cells have an abnormal "sickle" shape. ? Liver disease.  Are obese. What are the signs or symptoms? Symptoms of this condition can range from mild to severe. Symptoms may appear any time from 2 to 14 days after being exposed to the virus. They include:  A fever or chills.  A cough.  Difficulty breathing.  Headaches, body aches, or muscle aches.  Runny or stuffy (congested) nose.  A sore throat.  New loss of taste or smell. Some people may also have stomach problems, such as nausea, vomiting, or diarrhea. Other people may not have any symptoms of COVID-19. How is this diagnosed? This condition may be diagnosed based on:  Your signs and symptoms, especially if: ? You live in an area with a COVID-19 outbreak. ? You recently traveled to or from an area where the virus is common. ? You  provide care for or live with a person who was diagnosed with COVID-19. ? You were exposed to a person who was diagnosed with COVID-19.  A physical exam.  Lab tests, which may include: ? Taking a sample of fluid from the back of your nose and throat (nasopharyngeal fluid), your nose, or your throat using a swab. ? A sample of mucus from your lungs (sputum). ? Blood tests.  Imaging tests,  which may include, X-rays, CT scan, or ultrasound. How is this treated? At present, there is no medicine to treat COVID-19. Medicines that treat other diseases are being used on a trial basis to see if they are effective against COVID-19. Your health care provider will talk with you about ways to treat your symptoms. For most people, the infection is mild and can be managed at home with rest, fluids, and over-the-counter medicines. Treatment for a serious infection usually takes places in a hospital intensive care unit (ICU). It may include one or more of the following treatments. These treatments are given until your symptoms improve.  Receiving fluids and medicines through an IV.  Supplemental oxygen. Extra oxygen is given through a tube in the nose, a face mask, or a hood.  Positioning you to lie on your stomach (prone position). This makes it easier for oxygen to get into the lungs.  Continuous positive airway pressure (CPAP) or bi-level positive airway pressure (BPAP) machine. This treatment uses mild air pressure to keep the airways open. A tube that is connected to a motor delivers oxygen to the body.  Ventilator. This treatment moves air into and out of the lungs by using a tube that is placed in your windpipe.  Tracheostomy. This is a procedure to create a hole in the neck so that a breathing tube can be inserted.  Extracorporeal membrane oxygenation (ECMO). This procedure gives the lungs a chance to recover by taking over the functions of the heart and lungs. It supplies oxygen to the body and removes carbon dioxide. Follow these instructions at home: Lifestyle  If you are sick, stay home except to get medical care. Your health care provider will tell you how long to stay home. Call your health care provider before you go for medical care.  Rest at home as told by your health care provider.  Do not use any products that contain nicotine or tobacco, such as cigarettes,  e-cigarettes, and chewing tobacco. If you need help quitting, ask your health care provider.  Return to your normal activities as told by your health care provider. Ask your health care provider what activities are safe for you. General instructions  Take over-the-counter and prescription medicines only as told by your health care provider.  Drink enough fluid to keep your urine pale yellow.  Keep all follow-up visits as told by your health care provider. This is important. How is this prevented?  There is no vaccine to help prevent COVID-19 infection. However, there are steps you can take to protect yourself and others from this virus. To protect yourself:   Do not travel to areas where COVID-19 is a risk. The areas where COVID-19 is reported change often. To identify high-risk areas and travel restrictions, check the CDC travel website: FatFares.com.br  If you live in, or must travel to, an area where COVID-19 is a risk, take precautions to avoid infection. ? Stay away from people who are sick. ? Wash your hands often with soap and water for 20 seconds. If soap and water  are not available, use an alcohol-based hand sanitizer. ? Avoid touching your mouth, face, eyes, or nose. ? Avoid going out in public, follow guidance from your state and local health authorities. ? If you must go out in public, wear a cloth face covering or face mask. Make sure your mask covers your nose and mouth. ? Avoid crowded indoor spaces. Stay at least 6 feet (2 meters) away from others. ? Disinfect objects and surfaces that are frequently touched every day. This may include:  Counters and tables.  Doorknobs and light switches.  Sinks and faucets.  Electronics, such as phones, remote controls, keyboards, computers, and tablets. To protect others: If you have symptoms of COVID-19, take steps to prevent the virus from spreading to others.  If you think you have a COVID-19 infection, contact  your health care provider right away. Tell your health care team that you think you may have a COVID-19 infection.  Stay home. Leave your house only to seek medical care. Do not use public transport.  Do not travel while you are sick.  Wash your hands often with soap and water for 20 seconds. If soap and water are not available, use alcohol-based hand sanitizer.  Stay away from other members of your household. Let healthy household members care for children and pets, if possible. If you have to care for children or pets, wash your hands often and wear a mask. If possible, stay in your own room, separate from others. Use a different bathroom.  Make sure that all people in your household wash their hands well and often.  Cough or sneeze into a tissue or your sleeve or elbow. Do not cough or sneeze into your hand or into the air.  Wear a cloth face covering or face mask. Make sure your mask covers your nose and mouth. Where to find more information  Centers for Disease Control and Prevention: PurpleGadgets.be  World Health Organization: https://www.castaneda.info/ Contact a health care provider if:  You live in or have traveled to an area where COVID-19 is a risk and you have symptoms of the infection.  You have had contact with someone who has COVID-19 and you have symptoms of the infection. Get help right away if:  You have trouble breathing.  You have pain or pressure in your chest.  You have confusion.  You have bluish lips and fingernails.  You have difficulty waking from sleep.  You have symptoms that get worse. These symptoms may represent a serious problem that is an emergency. Do not wait to see if the symptoms will go away. Get medical help right away. Call your local emergency services (911 in the [place] Do not drive yourself to the hospital. Let the emergency medical personnel know if you think you have  COVID-19. Summary  COVID-19 is a respiratory infection that is caused by a virus. It is also known as coronavirus disease or novel coronavirus. It can cause serious infections, such as pneumonia, acute respiratory distress syndrome, acute respiratory failure, or sepsis.  The virus that causes COVID-19 is contagious. This means that it can spread from person to person through droplets from breathing, speaking, singing, coughing, or sneezing.  You are more likely to develop a serious illness if you are 70 years of age or older, have a weak immune system, live in a nursing home, or have chronic disease.  There is no medicine to treat COVID-19. Your health care provider will talk with you about ways to treat your symptoms.  Take steps to protect yourself and others from infection. Wash your hands often and disinfect objects and surfaces that are frequently touched every day. Stay away from people who are sick and wear a mask if you are sick. This information is not intended to replace advice given to you by your health care provider. Make sure you discuss any questions you have with your health care provider. Document Revised: 05/14/2019 Document Reviewed: 08/20/2018 Elsevier Patient Education  2020 [employer].   COVID-19 Frequently Asked Questions COVID-19 (coronavirus disease) is an infection that is caused by a large family of viruses. Some viruses cause illness in people and others cause illness in animals like camels, cats, and bats. In some cases, the viruses that cause illness in animals can spread to humans. Where did the coronavirus come from? In December 2019, [place] told the [facility]) of several cases of lung disease (human respiratory illness). These cases were linked to an open seafood and livestock market in the city of [place]. The link to the seafood and livestock market suggests that the virus may have spread from animals to humans. However, since that first  outbreak in December, the virus has also been shown to spread from person to person. What is the name of the disease and the virus? Disease name Early on, this disease was called novel coronavirus. This is because scientists determined that the disease was caused by a new (novel) respiratory virus. The World Health Organization [facility]) has now named the disease COVID-19, or coronavirus disease. Virus name The virus that causes the disease is called severe acute respiratory syndrome coronavirus 2 (SARS-CoV-2). More information on disease and virus naming World Health Organization [facility]): www.who.int/emergencies/diseases/novel-coronavirus-2019/technical-guidance/naming-the-coronavirus-disease-(covid-2019)-and-the-virus-that-causes-it Who is at risk for complications from coronavirus disease? Some people may be at higher risk for complications from coronavirus disease. This includes older adults and people who have chronic diseases, such as heart disease, diabetes, and lung disease. If you are at higher risk for complications, take these extra precautions:  Stay home as much as possible.  Avoid social gatherings and travel.  Avoid close contact with others. Stay at least 6 ft (2 m) away from others, if possible.  Wash your hands often with soap and water for at least 20 seconds.  Avoid touching your face, mouth, nose, or eyes.  Keep supplies on hand at home, such as food, medicine, and cleaning supplies.  If you must go out in public, wear a cloth face covering or face mask. Make sure your mask covers your nose and mouth. How does coronavirus disease spread? The virus that causes coronavirus disease spreads easily from person to person (is contagious). You may catch the virus by:  Breathing in droplets from an infected person. Droplets can be spread by a person breathing, speaking, singing, coughing, or sneezing.  Touching something, like a table or a doorknob, that was exposed to the virus  (contaminated) and then touching your mouth, nose, or eyes. Can I get the virus from touching surfaces or objects? There is still a lot that we do not know about the virus that causes coronavirus disease. Scientists are basing a lot of information on what they know about similar viruses, such as:  Viruses cannot generally survive on surfaces for long. They need a human body (host) to survive.  It is more likely that the virus is spread by close contact with people who are sick (direct contact), such as through: ? Shaking hands or hugging. ? Breathing in respiratory droplets  that travel through the air. Droplets can be spread by a person breathing, speaking, singing, coughing, or sneezing.  It is less likely that the virus is spread when a person touches a surface or object that has the virus on it (indirect contact). The virus may be able to enter the body if the person touches a surface or object and then touches his or her face, eyes, nose, or mouth. Can a person spread the virus without having symptoms of the disease? It may be possible for the virus to spread before a person has symptoms of the disease, but this is most likely not the main way the virus is spreading. It is more likely for the virus to spread by being in close contact with people who are sick and breathing in the respiratory droplets spread by a person breathing, speaking, singing, coughing, or sneezing. What are the symptoms of coronavirus disease? Symptoms vary from person to person and can range from mild to severe. Symptoms may include:  Fever or chills.  Cough.  Difficulty breathing or feeling short of breath.  Headaches, body aches, or muscle aches.  Runny or stuffy (congested) nose.  Sore throat.  New loss of taste or smell.  Nausea, vomiting, or diarrhea. These symptoms can appear anywhere from 2 to 14 days after you have been exposed to the virus. Some people may not have any symptoms. If you develop  symptoms, call your health care provider. People with severe symptoms may need hospital care. Should I be tested for this virus? Your health care provider will decide whether to test you based on your symptoms, history of exposure, and your risk factors. How does a health care provider test for this virus? Health care providers will collect samples to send for testing. Samples may include:  Taking a swab of fluid from the back of your nose and throat, your nose, or your throat.  Taking fluid from the lungs by having you cough up mucus (sputum) into a sterile cup.  Taking a blood sample. Is there a treatment or vaccine for this virus? Currently, there is no vaccine to prevent coronavirus disease. Also, there are no medicines like antibiotics or antivirals to treat the virus. A person who becomes sick is given supportive care, which means rest and fluids. A person may also relieve his or her symptoms by using over-the-counter medicines that treat sneezing, coughing, and runny nose. These are the same medicines that a person takes for the common cold. If you develop symptoms, call your health care provider. People with severe symptoms may need hospital care. What can I do to protect myself and my family from this virus?     You can protect yourself and your family by taking the same actions that you would take to prevent the spread of other viruses. Take the following actions:  Wash your hands often with soap and water for at least 20 seconds. If soap and water are not available, use alcohol-based hand sanitizer.  Avoid touching your face, mouth, nose, or eyes.  Cough or sneeze into a tissue, sleeve, or elbow. Do not cough or sneeze into your hand or the air. ? If you cough or sneeze into a tissue, throw it away immediately and wash your hands.  Disinfect objects and surfaces that you frequently touch every day.  Stay away from people who are sick.  Avoid going out in public, follow  guidance from your state and local health authorities.  Avoid crowded indoor spaces.  Stay at least 6 ft (2 m) away from others.  If you must go out in public, wear a cloth face covering or face mask. Make sure your mask covers your nose and mouth.  Stay home if you are sick, except to get medical care. Call your health care provider before you get medical care. Your health care provider will tell you how long to stay home.  Make sure your vaccines are up to date. Ask your health care provider what vaccines you need. What should I do if I need to travel? Follow travel recommendations from your local health authority, the CDC, and WHO. Travel information and advice  Centers for Disease Control and Prevention (CDC): BodyEditor.hu  World Health Organization [facility]): ThirdIncome.ca Know the risks and take action to protect your health  You are at higher risk of getting coronavirus disease if you are traveling to areas with an outbreak or if you are exposed to travelers from areas with an outbreak.  Wash your hands often and practice good hygiene to lower the risk of catching or spreading the virus. What should I do if I am sick? General instructions to stop the spread of infection  Wash your hands often with soap and water for at least 20 seconds. If soap and water are not available, use alcohol-based hand sanitizer.  Cough or sneeze into a tissue, sleeve, or elbow. Do not cough or sneeze into your hand or the air.  If you cough or sneeze into a tissue, throw it away immediately and wash your hands.  Stay home unless you must get medical care. Call your health care provider or local health authority before you get medical care.  Avoid public areas. Do not take public transportation, if possible.  If you can, wear a mask if you must go out of the house or if you are in close contact with someone  who is not sick. Make sure your mask covers your nose and mouth. Keep your home clean  Disinfect objects and surfaces that are frequently touched every day. This may include: ? Counters and tables. ? Doorknobs and light switches. ? Sinks and faucets. ? Electronics such as phones, remote controls, keyboards, computers, and tablets.  Wash dishes in hot, soapy water or use a dishwasher. Air-dry your dishes.  Wash laundry in hot water. Prevent infecting other household members  Let healthy household members care for children and pets, if possible. If you have to care for children or pets, wash your hands often and wear a mask.  Sleep in a different bedroom or bed, if possible.  Do not share personal items, such as razors, toothbrushes, deodorant, combs, brushes, towels, and washcloths. Where to find more information Centers for Disease Control and Prevention (CDC)  Information and news updates: https://www.butler-gonzalez.com/ World Health Organization [facility])  Information and news updates: MissExecutive.com.ee  Coronavirus health topic: https://www.castaneda.info/  Questions and answers on COVID-19: OpportunityDebt.at  Global tracker: who.sprinklr.com American Academy of Pediatrics (AAP)  Information for families: www.healthychildren.org/English/health-issues/conditions/chest-lungs/Pages/2019-Novel-Coronavirus.aspx The coronavirus situation is changing rapidly. Check your local health authority website or the CDC and [facility] websites for updates and news. When should I contact a health care provider?  Contact your health care provider if you have symptoms of an infection, such as fever or cough, and you: ? Have been near anyone who is known to have coronavirus disease. ? Have come into contact with a person who is suspected to have coronavirus disease. ? Have traveled to an area where there is  an outbreak of  COVID-19. When should I get emergency medical care?  Get help right away by calling your local emergency services (911 in the [place] if you have: ? Trouble breathing. ? Pain or pressure in your chest. ? Confusion. ? Blue-tinged lips and fingernails. ? Difficulty waking from sleep. ? Symptoms that get worse. Let the emergency medical personnel know if you think you have coronavirus disease. Summary  A new respiratory virus is spreading from person to person and causing COVID-19 (coronavirus disease).  The virus that causes COVID-19 appears to spread easily. It spreads from one person to another through droplets from breathing, speaking, singing, coughing, or sneezing.  Older adults and those with chronic diseases are at higher risk of disease. If you are at higher risk for complications, take extra precautions.  There is currently no vaccine to prevent coronavirus disease. There are no medicines, such as antibiotics or antivirals, to treat the virus.  You can protect yourself and your family by washing your hands often, avoiding touching your face, and covering your coughs and sneezes. This information is not intended to replace advice given to you by your health care provider. Make sure you discuss any questions you have with your health care provider. Document Revised: 05/14/2019 Document Reviewed: 11/10/2018 Elsevier Patient Education  [place].

## 2020-07-31 DIAGNOSIS — M79621 Pain in right upper arm: Secondary | ICD-10-CM | POA: Diagnosis not present

## 2020-07-31 DIAGNOSIS — Z0000 Encounter for general adult medical examination without abnormal findings: Secondary | ICD-10-CM | POA: Diagnosis not present

## 2021-05-25 DIAGNOSIS — Z125 Encounter for screening for malignant neoplasm of prostate: Secondary | ICD-10-CM | POA: Diagnosis not present

## 2021-05-25 DIAGNOSIS — R7309 Other abnormal glucose: Secondary | ICD-10-CM | POA: Diagnosis not present

## 2021-05-25 DIAGNOSIS — Z1211 Encounter for screening for malignant neoplasm of colon: Secondary | ICD-10-CM | POA: Diagnosis not present

## 2021-05-25 DIAGNOSIS — E782 Mixed hyperlipidemia: Secondary | ICD-10-CM | POA: Diagnosis not present

## 2021-05-25 DIAGNOSIS — I10 Essential (primary) hypertension: Secondary | ICD-10-CM | POA: Diagnosis not present

## 2021-08-10 IMAGING — CR DG CHEST 2V
2 series · 2 of 2 positions shown · non-contrast
Comparison: May 03, 2020

CLINICAL DATA: Cough and shortness of breath. Decreased oxygen
saturation

EXAM:
CHEST - 2 VIEW

[x chest ap]
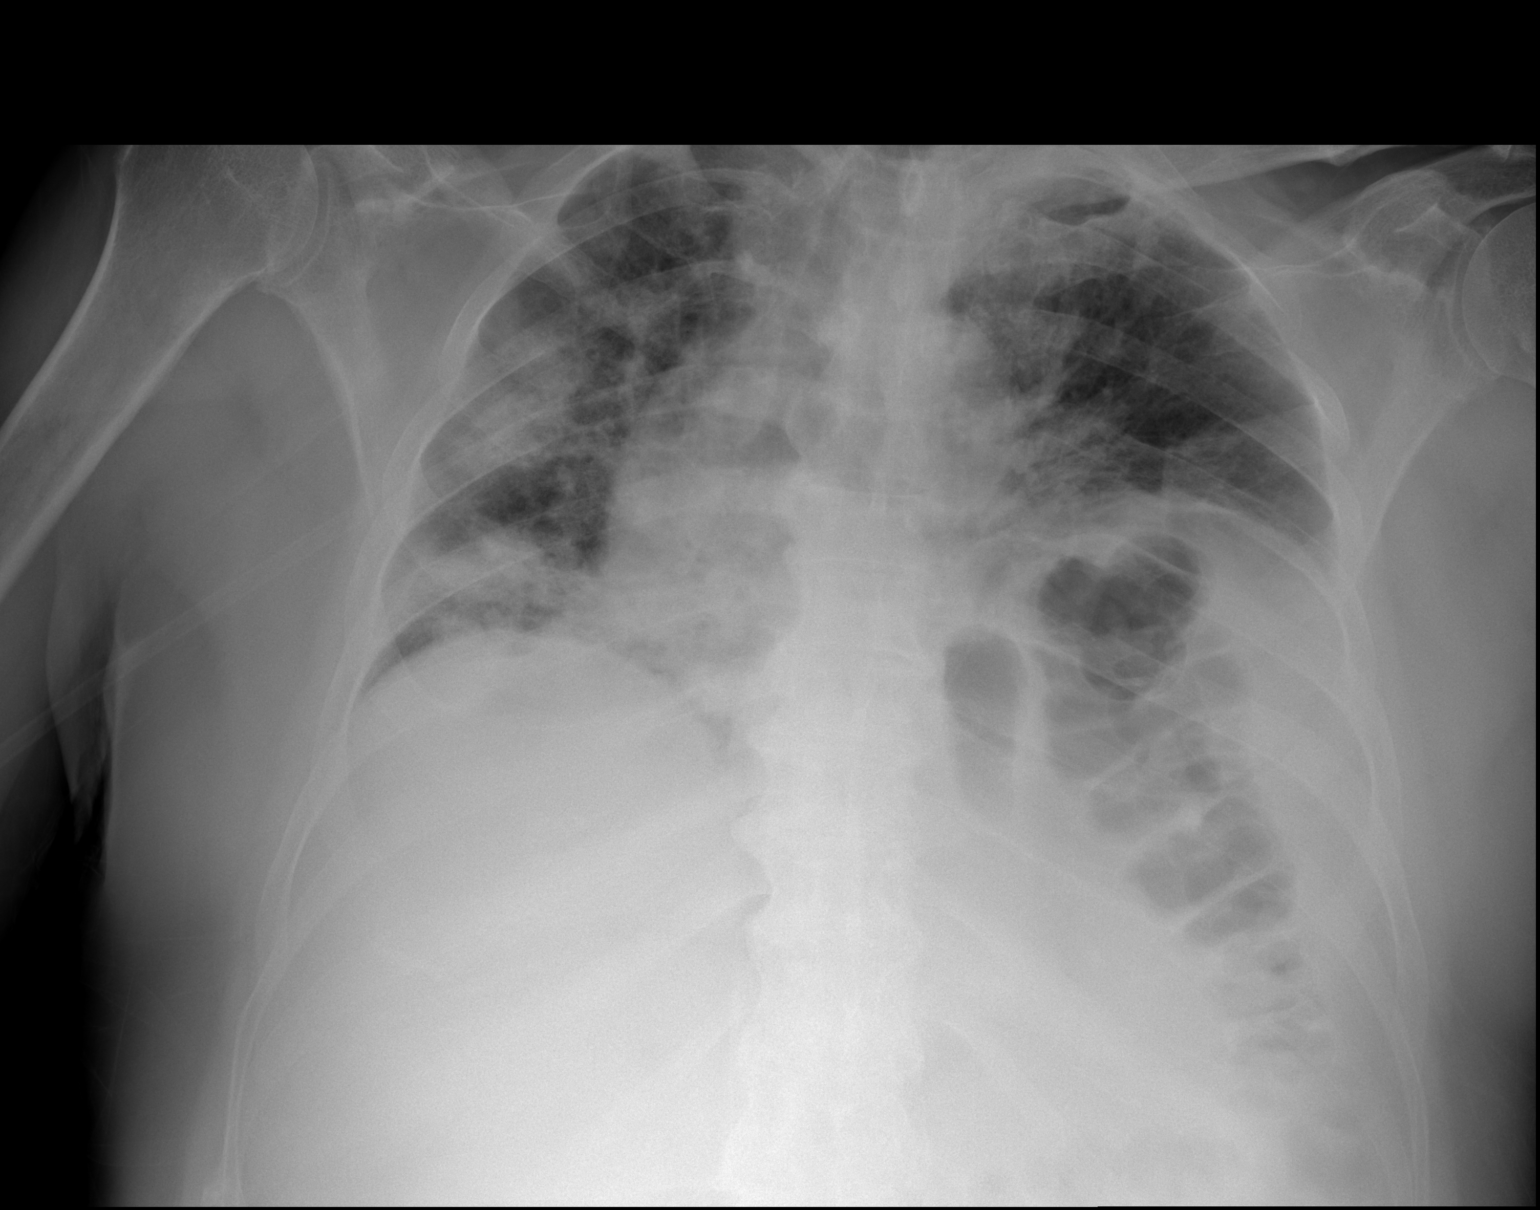

[w chest lat]
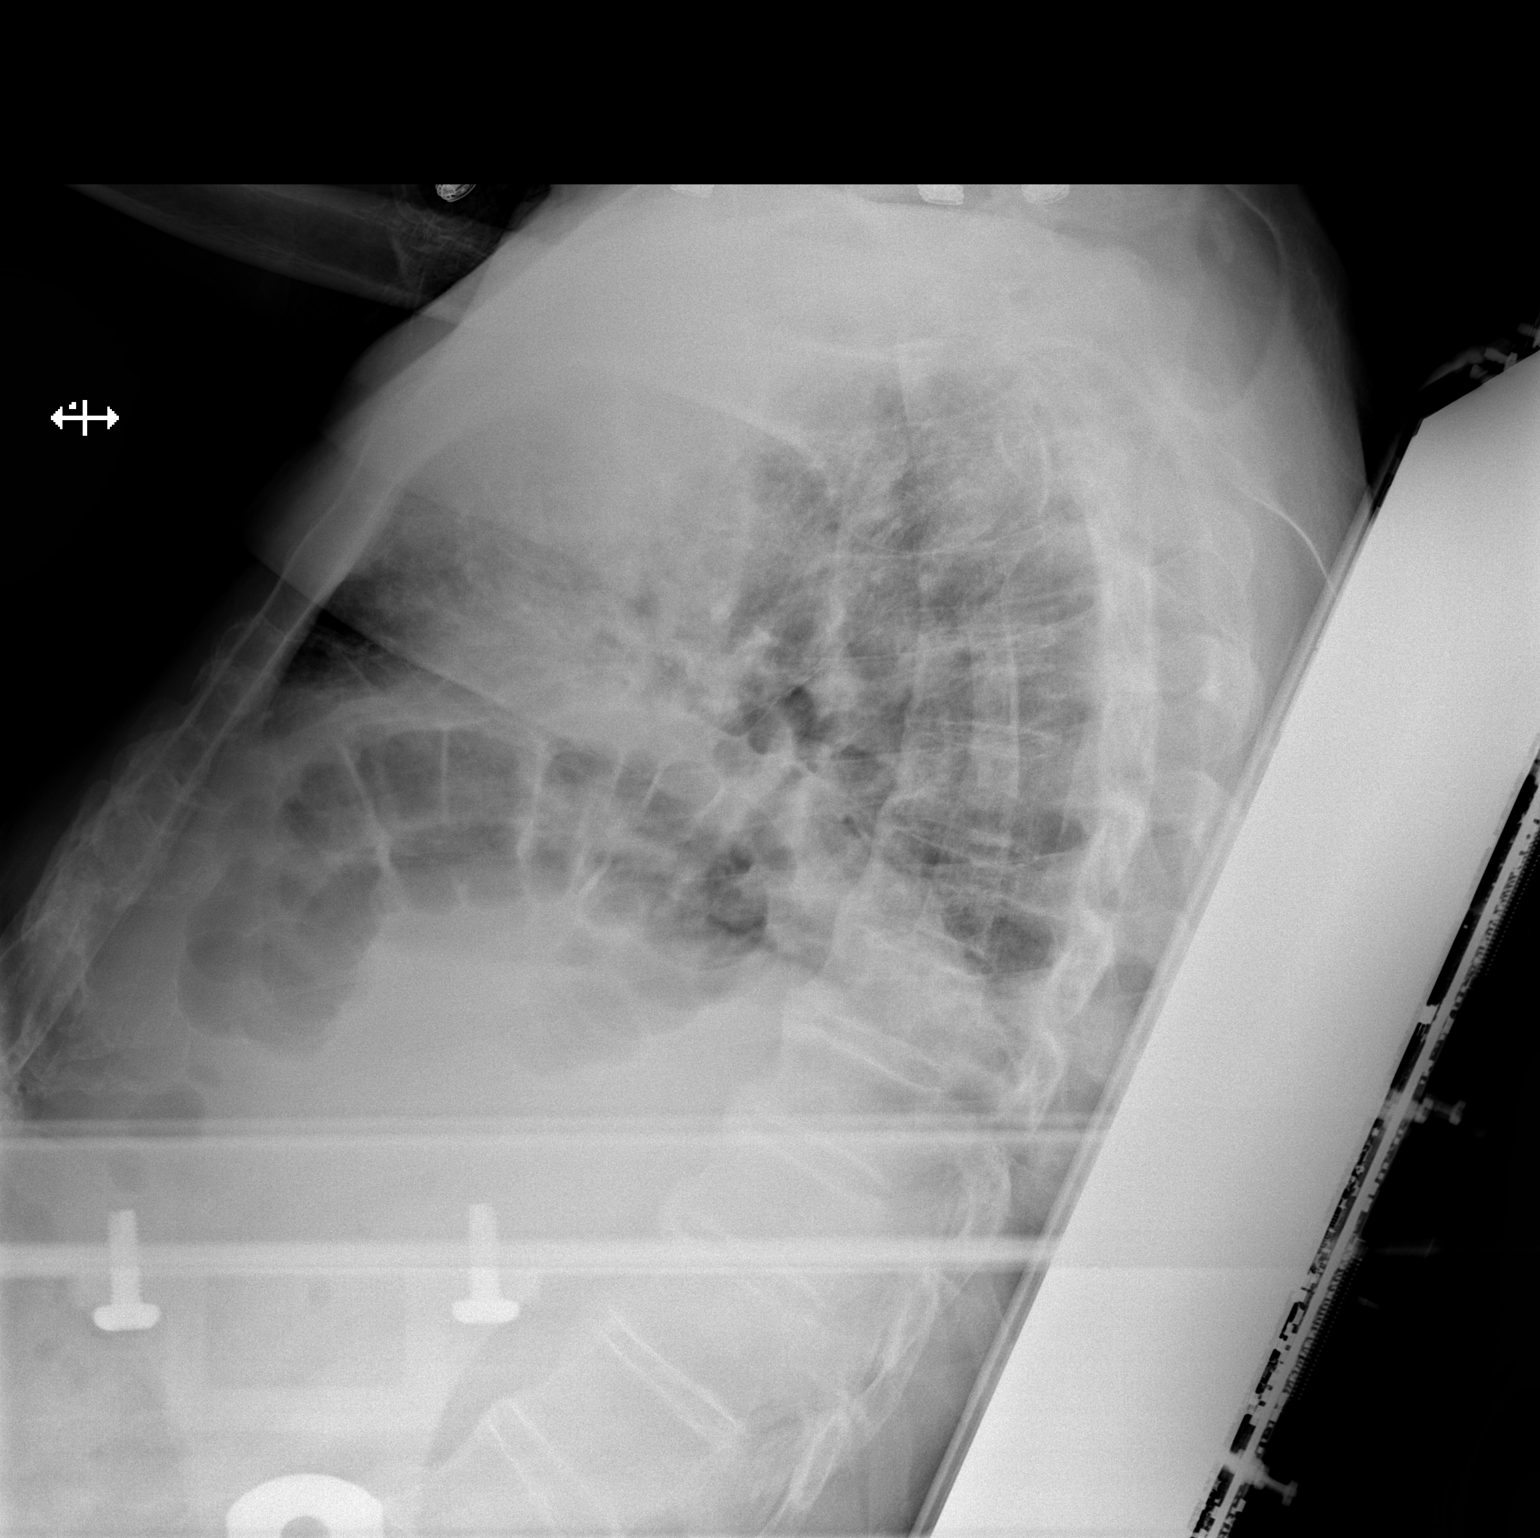

[2 of 2 positions shown; findings below may reference images not displayed]

FINDINGS: Shallow degree of inspiration. Increase in airspace consolidation in
the right mid and lower lung regions. Atelectatic change left base.
Heart is upper normal in size with pulmonary vascularity normal. No
adenopathy. There is multifocal arthropathy in the lumbar spine.
IMPRESSION: Shallow degree of inspiration. Airspace opacity consistent with
multifocal pneumonia on the right in the mid and lower lung regions
with increase in consolidation compared to 2 days prior. Left base
atelectasis. Stable cardiac silhouette. Check of [id number] status may
be advisable in this circumstance.

## 2022-03-21 DIAGNOSIS — H5203 Hypermetropia, bilateral: Secondary | ICD-10-CM | POA: Diagnosis not present

## 2022-03-21 DIAGNOSIS — Z0100 Encounter for examination of eyes and vision without abnormal findings: Secondary | ICD-10-CM | POA: Diagnosis not present

## 2022-03-21 DIAGNOSIS — H4322 Crystalline deposits in vitreous body, left eye: Secondary | ICD-10-CM | POA: Diagnosis not present

## 2023-04-11 DIAGNOSIS — J3489 Other specified disorders of nose and nasal sinuses: Secondary | ICD-10-CM | POA: Diagnosis not present

## 2023-04-11 DIAGNOSIS — R9389 Abnormal findings on diagnostic imaging of other specified body structures: Secondary | ICD-10-CM | POA: Diagnosis not present

## 2023-04-11 DIAGNOSIS — R0989 Other specified symptoms and signs involving the circulatory and respiratory systems: Secondary | ICD-10-CM | POA: Diagnosis not present

## 2023-04-11 DIAGNOSIS — J069 Acute upper respiratory infection, unspecified: Secondary | ICD-10-CM | POA: Diagnosis not present

## 2023-04-11 DIAGNOSIS — Z20822 Contact with and (suspected) exposure to covid-19: Secondary | ICD-10-CM | POA: Diagnosis not present

## 2023-04-11 DIAGNOSIS — R051 Acute cough: Secondary | ICD-10-CM | POA: Diagnosis not present

## 2023-04-11 DIAGNOSIS — J9811 Atelectasis: Secondary | ICD-10-CM | POA: Diagnosis not present

## 2023-04-23 DIAGNOSIS — Z0000 Encounter for general adult medical examination without abnormal findings: Secondary | ICD-10-CM | POA: Diagnosis not present

## 2023-04-23 DIAGNOSIS — I10 Essential (primary) hypertension: Secondary | ICD-10-CM | POA: Diagnosis not present

## 2023-04-23 DIAGNOSIS — Z13220 Encounter for screening for lipoid disorders: Secondary | ICD-10-CM | POA: Diagnosis not present

## 2023-11-04 DIAGNOSIS — E782 Mixed hyperlipidemia: Secondary | ICD-10-CM | POA: Diagnosis not present

## 2023-11-04 DIAGNOSIS — L570 Actinic keratosis: Secondary | ICD-10-CM | POA: Diagnosis not present

## 2023-11-04 DIAGNOSIS — R7309 Other abnormal glucose: Secondary | ICD-10-CM | POA: Diagnosis not present

## 2023-11-04 DIAGNOSIS — I10 Essential (primary) hypertension: Secondary | ICD-10-CM | POA: Diagnosis not present

## 2024-06-21 DIAGNOSIS — H4322 Crystalline deposits in vitreous body, left eye: Secondary | ICD-10-CM | POA: Diagnosis not present

## 2024-06-21 DIAGNOSIS — H524 Presbyopia: Secondary | ICD-10-CM | POA: Diagnosis not present
